# Patient Record
Sex: Female | Born: 1935 | Race: White | Hispanic: No | Smoking: Never smoker
Health system: Southern US, Community
[De-identification: ages and names within clinical notes are randomized; demographics above are authoritative.]

## PROBLEM LIST (undated history)

## (undated) DIAGNOSIS — C801 Malignant (primary) neoplasm, unspecified: Secondary | ICD-10-CM

## (undated) DIAGNOSIS — M1990 Unspecified osteoarthritis, unspecified site: Secondary | ICD-10-CM

## (undated) DIAGNOSIS — E785 Hyperlipidemia, unspecified: Secondary | ICD-10-CM

## (undated) DIAGNOSIS — I10 Essential (primary) hypertension: Secondary | ICD-10-CM

## (undated) DIAGNOSIS — I2510 Atherosclerotic heart disease of native coronary artery without angina pectoris: Secondary | ICD-10-CM

## (undated) DIAGNOSIS — G459 Transient cerebral ischemic attack, unspecified: Secondary | ICD-10-CM

## (undated) DIAGNOSIS — M7989 Other specified soft tissue disorders: Secondary | ICD-10-CM

## (undated) HISTORY — PX: ABDOMINAL HYSTERECTOMY: SHX81

## (undated) HISTORY — DX: Atherosclerotic heart disease of native coronary artery without angina pectoris: I25.10

## (undated) HISTORY — PX: TONSILLECTOMY: SUR1361

## (undated) HISTORY — PX: CORONARY ANGIOPLASTY WITH STENT PLACEMENT: SHX49

## (undated) HISTORY — DX: Essential (primary) hypertension: I10

## (undated) HISTORY — PX: EYE SURGERY: SHX253

## (undated) HISTORY — PX: APPENDECTOMY: SHX54

## (undated) HISTORY — DX: Hyperlipidemia, unspecified: E78.5

## (undated) SURGERY — OPEN REDUCTION INTERNAL FIXATION HIP
Anesthesia: Choice | Laterality: Left

---

## 1999-02-22 HISTORY — PX: KNEE ARTHROSCOPY: SUR90

## 1999-09-14 ENCOUNTER — Encounter: Admission: RE | Admit: 1999-09-14 | Discharge: 1999-09-14 | Payer: Self-pay | Admitting: Orthopedic Surgery

## 1999-09-14 ENCOUNTER — Encounter: Payer: Self-pay | Admitting: Orthopedic Surgery

## 1999-09-14 ENCOUNTER — Ambulatory Visit (HOSPITAL_BASED_OUTPATIENT_CLINIC_OR_DEPARTMENT_OTHER): Admission: RE | Admit: 1999-09-14 | Discharge: 1999-09-15 | Payer: Self-pay | Admitting: Orthopedic Surgery

## 1999-09-27 ENCOUNTER — Encounter: Admission: RE | Admit: 1999-09-27 | Discharge: 1999-10-15 | Payer: Self-pay | Admitting: Orthopedic Surgery

## 2001-12-11 ENCOUNTER — Other Ambulatory Visit: Admission: RE | Admit: 2001-12-11 | Discharge: 2001-12-11 | Payer: Self-pay | Admitting: Family Medicine

## 2008-01-07 ENCOUNTER — Encounter: Admission: RE | Admit: 2008-01-07 | Discharge: 2008-01-07 | Payer: Self-pay | Admitting: Family Medicine

## 2008-03-07 ENCOUNTER — Emergency Department (HOSPITAL_COMMUNITY): Admission: EM | Admit: 2008-03-07 | Discharge: 2008-03-07 | Payer: Self-pay | Admitting: Emergency Medicine

## 2009-06-15 ENCOUNTER — Ambulatory Visit: Payer: Medicare Other | Admitting: Ophthalmology

## 2009-06-22 ENCOUNTER — Ambulatory Visit: Payer: Medicare Other | Admitting: Ophthalmology

## 2009-08-27 IMAGING — US US CAROTID DUPLEX BILAT
1 series · 13 of 24 positions shown · non-contrast
Comparison: None

CLINICAL DATA: Recurrent TIA with episodes of inability to speak
and diplopia.

BILATERAL CAROTID DUPLEX ULTRASOUND
TECHNIQUE: Gray scale imaging, color Doppler and duplex ultrasound
was performed of bilateral carotid and vertebral arteries in the
neck.

[Series 1: us carotid duplex bilat · 0.08mm/px · 13 of 68 slices shown]
[im 1/68]
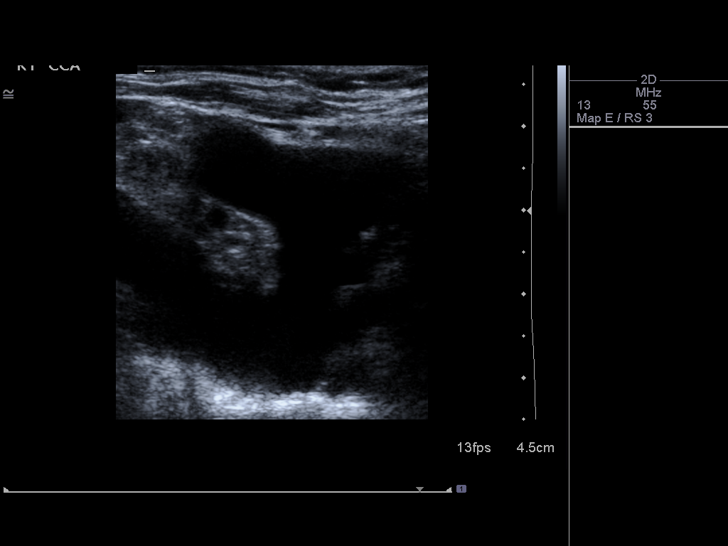
[im 6/68]
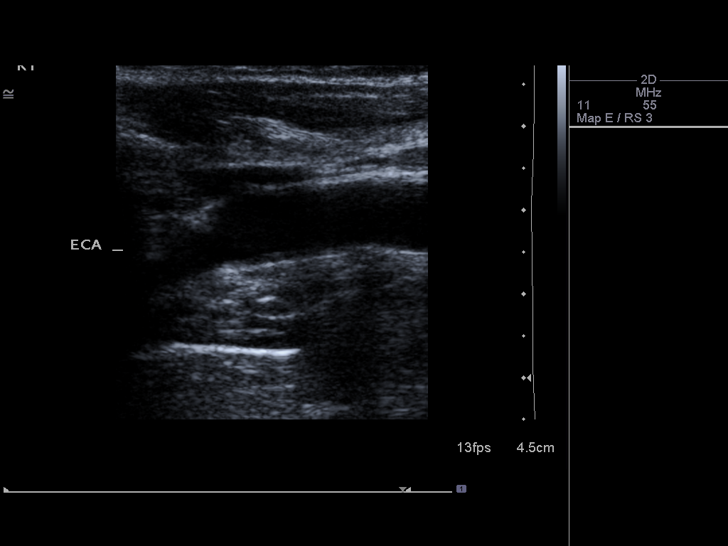
[im 12/68]
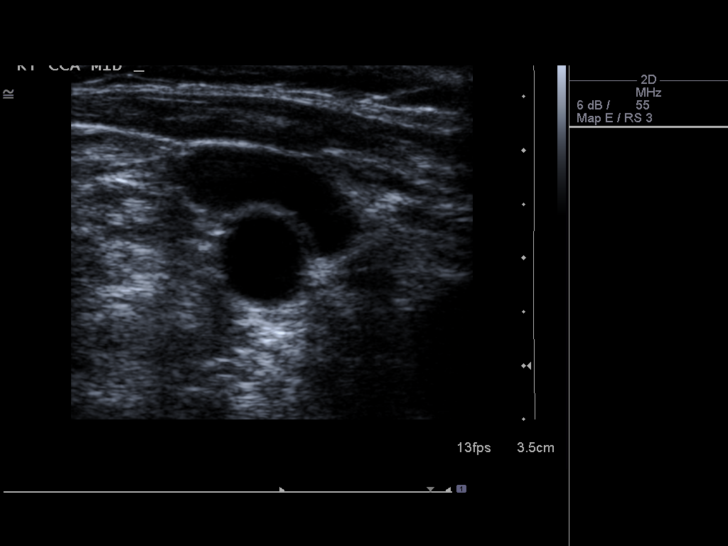
[im 18/68]
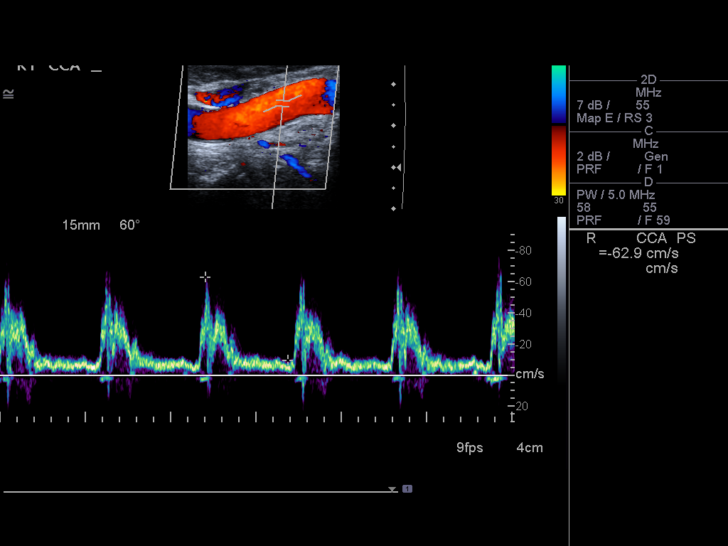
[im 24/68]
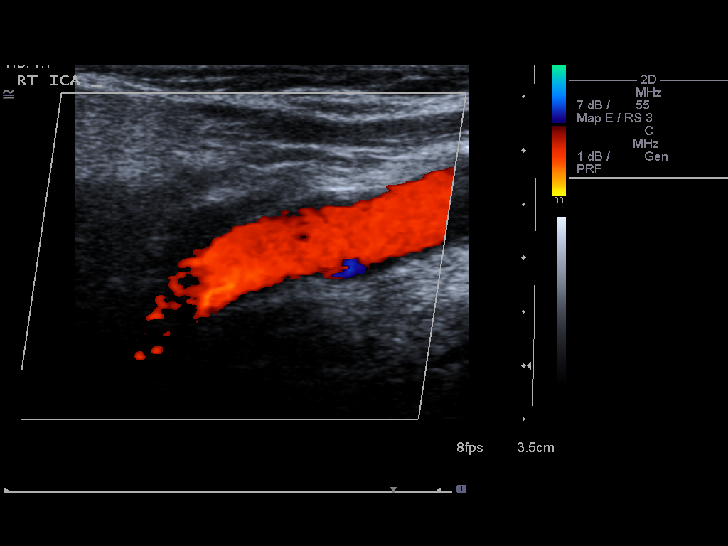
[im 30/68]
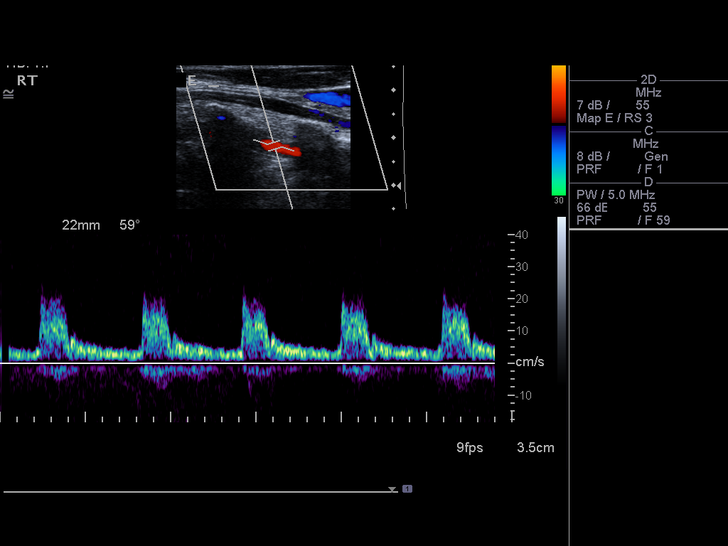
[im 35/68]
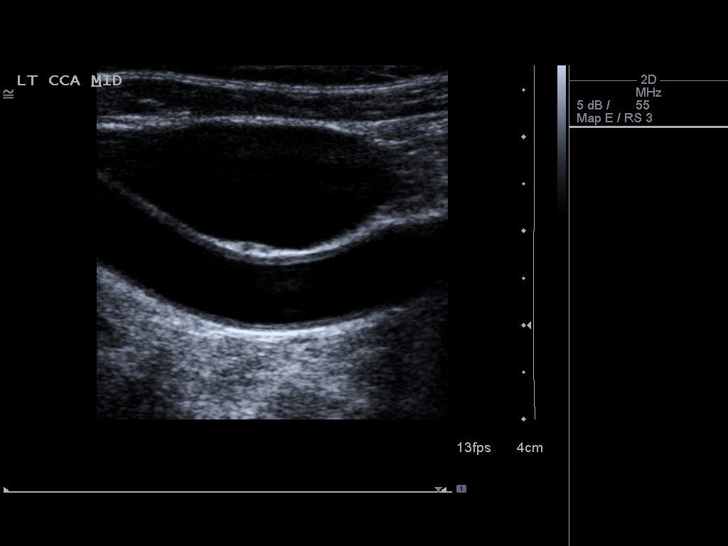
[im 38/68]
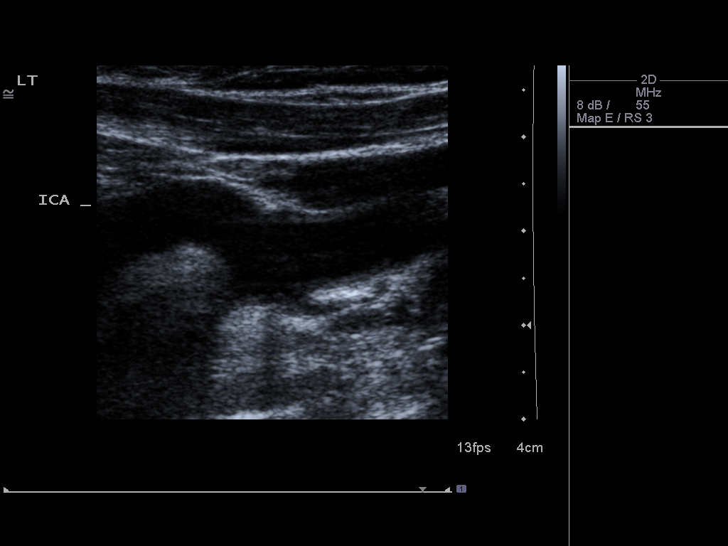
[im 44/68]
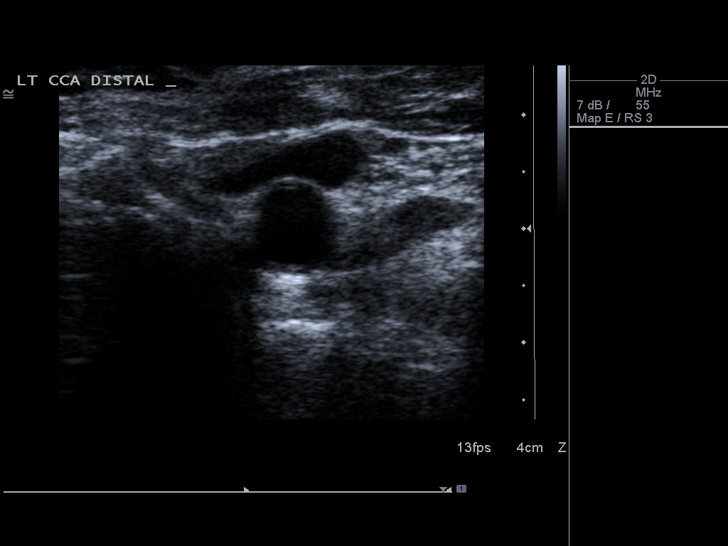
[im 50/68]
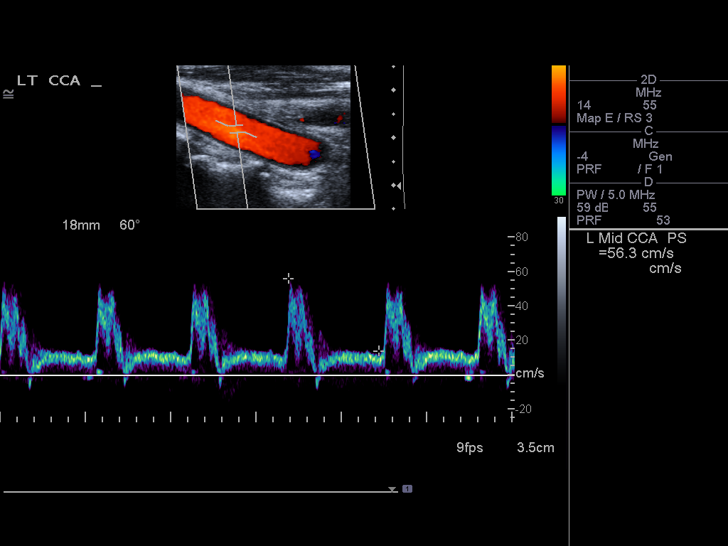
[im 56/68]
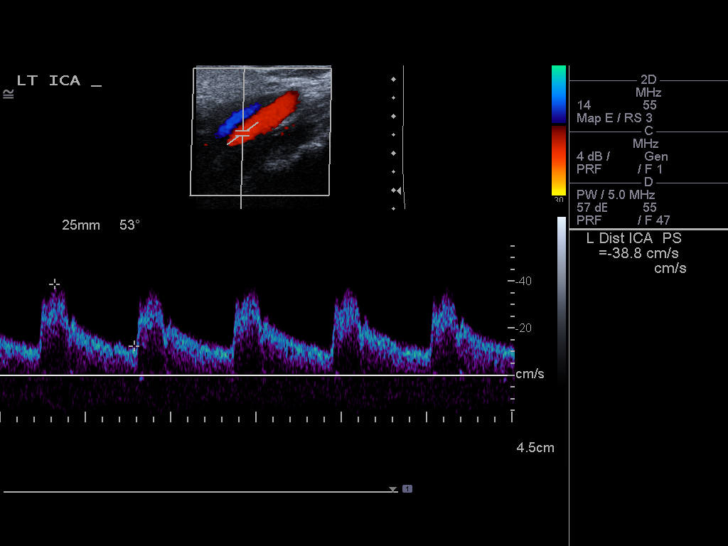
[im 62/68]
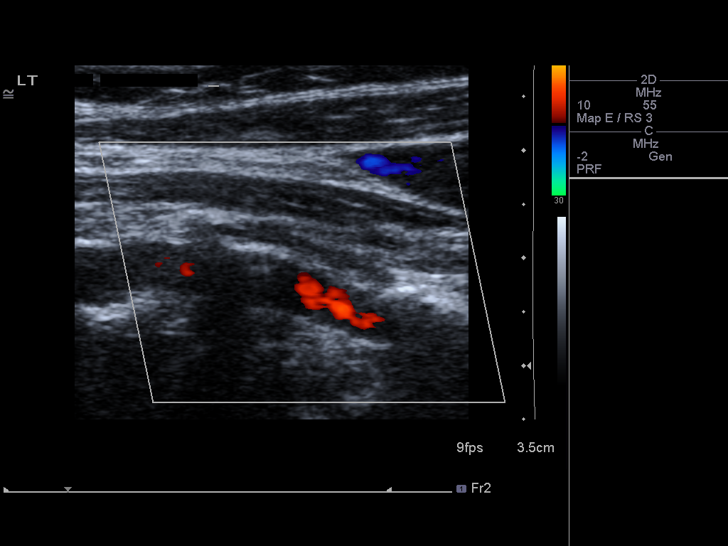
[im 68/68]
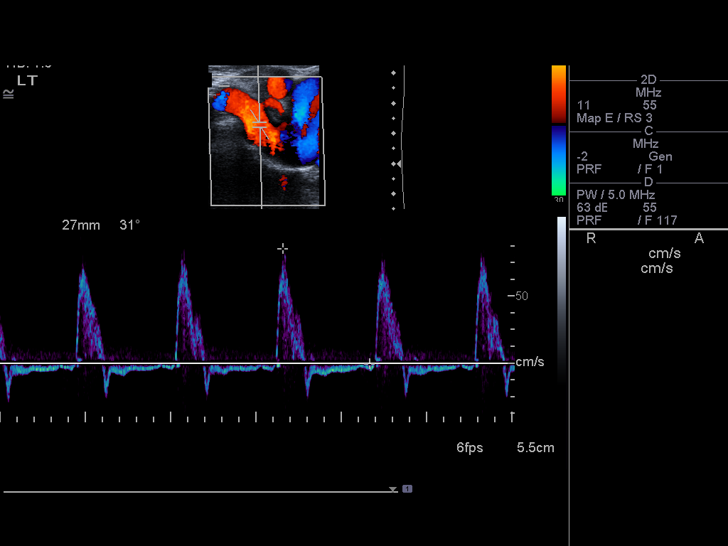

[13 of 24 positions shown; findings below may reference images not displayed]

Criteria:  Quantification of carotid stenosis is based on velocity
parameters that correlate the residual internal carotid diameter
with NASCET-based stenosis levels.

The following velocity measurements were obtained:

                 PEAK SYSTOLIC/END DIASTOLIC
RIGHT
ICA:                        55/17cm/sec
CCA:                        63/10cm/sec
SYSTOLIC ICA/CCA RATIO:
DIASTOLIC ICA/CCA RATIO:
ECA:                        30cm/sec

LEFT
ICA:                        39/12cm/sec
CCA:                        56/14cm/sec
SYSTOLIC ICA/CCA RATIO:
DIASTOLIC ICA/CCA RATIO:
ECA:                        34cm/sec
FINDINGS: RIGHT CAROTID ARTERY: Minimal plaque at right carotid bifurcation.
Normal velocities and waveforms correspond to an estimated less
than 50% ICA stenosis.  The internal carotid artery is moderately
tortuous in the neck.

RIGHT VERTEBRAL ARTERY:  Antegrade flow with normal wave form.

LEFT CAROTID ARTERY: Mild plaque in the distal common carotid
artery and proximal ICA.  Unremarkable velocities and waveforms
correspond to an estimated left ICA stenosis of less than 50%.

LEFT VERTEBRAL ARTERY:  Antegrade flow with normal wave form.

Cystic nodules are identified in the left lobe of the thyroid.
These are not fully characterized by carotid ultrasound.  Formal
evaluation with ultrasound of the thyroid gland may be helpful if
not previously performed.
IMPRESSION: No significant carotid stenosis identified by duplex ultrasound.
Bilateral estimated less than 50% ICA stenoses with minimal amount
of bilateral plaque.  Cystic nodules in the left lobe of thyroid
identified incidentally.  Evaluation with thyroid ultrasound may be
helpful.

## 2009-09-21 HISTORY — PX: LEFT HEART CATH: SHX5946

## 2009-10-09 ENCOUNTER — Inpatient Hospital Stay (HOSPITAL_COMMUNITY): Admission: EM | Admit: 2009-10-09 | Discharge: 2009-10-13 | Payer: Self-pay | Admitting: Emergency Medicine

## 2009-10-09 ENCOUNTER — Ambulatory Visit: Payer: Self-pay | Admitting: Cardiology

## 2009-10-10 ENCOUNTER — Encounter: Payer: Self-pay | Admitting: Cardiology

## 2009-10-20 DIAGNOSIS — I2510 Atherosclerotic heart disease of native coronary artery without angina pectoris: Secondary | ICD-10-CM | POA: Insufficient documentation

## 2009-10-20 DIAGNOSIS — E785 Hyperlipidemia, unspecified: Secondary | ICD-10-CM | POA: Insufficient documentation

## 2009-10-20 DIAGNOSIS — I10 Essential (primary) hypertension: Secondary | ICD-10-CM

## 2009-10-20 HISTORY — DX: Atherosclerotic heart disease of native coronary artery without angina pectoris: I25.10

## 2009-10-20 HISTORY — DX: Essential (primary) hypertension: I10

## 2009-10-20 HISTORY — DX: Hyperlipidemia, unspecified: E78.5

## 2009-10-29 ENCOUNTER — Ambulatory Visit: Payer: Self-pay | Admitting: Internal Medicine

## 2009-10-29 ENCOUNTER — Encounter: Payer: Self-pay | Admitting: Physician Assistant

## 2009-10-29 ENCOUNTER — Encounter: Payer: Self-pay | Admitting: Cardiology

## 2009-11-02 ENCOUNTER — Encounter: Payer: Self-pay | Admitting: Cardiology

## 2009-11-05 ENCOUNTER — Encounter: Payer: Self-pay | Admitting: Cardiology

## 2009-12-24 ENCOUNTER — Encounter (INDEPENDENT_AMBULATORY_CARE_PROVIDER_SITE_OTHER): Payer: Self-pay | Admitting: *Deleted

## 2009-12-31 ENCOUNTER — Ambulatory Visit: Payer: Self-pay | Admitting: Cardiology

## 2010-03-25 NOTE — Assessment & Plan Note (Signed)
Summary: eph./ appt is 11:30/ gd   Visit Type:  Post-hospital  CC:  Sob.  History of Present Illness: This is an 77 year old white female patient who was admitted to the hospital with chest pain and had a slight bump in her cardiac enzymes she underwent cardiac catheterization with stenting to the LAD October 09, 2009. She had diffuse 30% stenosis throughout the proximal mid RCA and 20% plaque in the proximal circumflex ejection fraction was 60%. She was placed on a beta blocker because of her bradycardia.  The patient has progressed slowly his discharge. She can walk 5 minutes before she becomes tired and short of breath. She has had dyspnea on exertion for several years and poor exercise tolerance. She denies any further chest pain palpitations dizziness or presyncope.  Current Medications (verified): 1)  Plavix 75 Mg Tabs (Clopidogrel Bisulfate) .... Take One Tablet By Mouth Daily 2)  Aspirin Ec 325 Mg Tbec (Aspirin) .... Take One Tablet By Mouth Daily 3)  Citracal Plus Bone Density  Tabs (Multiple Minerals-Vitamins) .... Take 1 Tablet By Mouth Once A Day 4)  Centrum Silver  Tabs (Multiple Vitamins-Minerals) .... Take 1 Tablet By Mouth Once A Day 5)  Pravastatin Sodium 40 Mg Tabs (Pravastatin Sodium) .... Take One Tablet By Mouth Daily At Bedtime 6)  Lisinopril 40 Mg Tabs (Lisinopril) .... Take One Tablet By Mouth Daily  Allergies (verified): 1)  ! Bactrim  Past History:  Past Medical History: Last updated: 10/20/2009  1. Two-vessel coronary artery disease, status post percutaneous       transluminal coronary angioplasty/drug-eluting stent of left       anterior descending.   2. Hypertension.   3. Hyperlipidemia.  Review of Systems       see history of present illness  Vital Signs:  Patient profile:   77 year old female Height:      64 inches Weight:      154.25 pounds BMI:     26.57 Pulse rate:   77 / minute Pulse rhythm:   regular Resp:     20 per minute BP sitting:    120 / 80  (left arm) Cuff size:   large  Vitals Entered By: Vikki Ports (October 29, 2009 11:16 AM)  Physical Exam  General:   Well-nournished, in no acute distress. Neck: No JVD, HJR, Bruit, or thyroid enlargement Lungs: No tachypnea, clear without wheezing, rales, or rhonchi Cardiovascular: RRR, PMI not displaced, heart sounds normal, no murmurs, gallops, bruit, thrill, or heave. Abdomen: BS normal. Soft without organomegaly, masses, lesions or tenderness. Extremities: right radial artery without hematoma or hemorrhage, lower extremities without cyanosis, clubbing or edema. Good distal pulses bilateral SKin: Warm, no lesions or rashes  Musculoskeletal: No deformities Neuro: no focal signs    EKG  Procedure date:  10/29/2009  Findings:      normal sinus rhythm normal EKG  Impression & Recommendations:  Problem # 1:  CAD (ICD-414.00)  Patient underwent stenting of the LAD October 09, 2009 with a slight bump in her cardiac enzymes. Patient is progressing slowly. I've recommended cardiac rehabilitation to help get her back on her feet again. Her updated medication list for this problem includes:    Plavix 75 Mg Tabs (Clopidogrel bisulfate) .Marland Kitchen... Take one tablet by mouth daily    Aspirin Ec 325 Mg Tbec (Aspirin) .Marland Kitchen... Take one tablet by mouth daily    Lisinopril 40 Mg Tabs (Lisinopril) .Marland Kitchen... Take one tablet by mouth daily  Orders: EKG w/  Interpretation (93000) Misc. Referral (Misc. Ref)  Her updated medication list for this problem includes:    Plavix 75 Mg Tabs (Clopidogrel bisulfate) .Marland Kitchen... Take one tablet by mouth daily    Aspirin Ec 325 Mg Tbec (Aspirin) .Marland Kitchen... Take one tablet by mouth daily    Lisinopril 40 Mg Tabs (Lisinopril) .Marland Kitchen... Take one tablet by mouth daily  Problem # 2:  HYPERTENSION (ICD-401.9)  treated Her updated medication list for this problem includes:    Aspirin Ec 325 Mg Tbec (Aspirin) .Marland Kitchen... Take one tablet by mouth daily    Lisinopril 40 Mg Tabs  (Lisinopril) .Marland Kitchen... Take one tablet by mouth daily  Orders: EKG w/ Interpretation (93000)  Her updated medication list for this problem includes:    Aspirin Ec 325 Mg Tbec (Aspirin) .Marland Kitchen... Take one tablet by mouth daily    Lisinopril 40 Mg Tabs (Lisinopril) .Marland Kitchen... Take one tablet by mouth daily  Problem # 3:  HYPERLIPIDEMIA (ICD-272.4)  treated Her updated medication list for this problem includes:    Pravastatin Sodium 40 Mg Tabs (Pravastatin sodium) .Marland Kitchen... Take one tablet by mouth daily at bedtime  Her updated medication list for this problem includes:    Pravastatin Sodium 40 Mg Tabs (Pravastatin sodium) .Marland Kitchen... Take one tablet by mouth daily at bedtime  Patient Instructions: 1)  Your physician recommends that you schedule a follow-up appointment in: 2 months with Dr. Daleen Squibb. 2)  Your physician recommends that you continue on your current medications as directed. Please refer to the Current Medication list given to you today. 3)  Your physician recommends referral and attendance at a Cardiac Rehab Program. Post. stent placement.

## 2010-03-25 NOTE — Miscellaneous (Signed)
Summary: [facility]   [facility]   Imported By: Roderic Ovens 11/26/2009 15:50:52  _____________________________________________________________________  External Attachment:    Type:   Image     Comment:   External Document

## 2010-03-25 NOTE — Letter (Signed)
Summary: Appointment - Missed  [facility], Main Office  [address]   [place], [place] [address]   Phone: [phone]  Fax: [phone]     December 24, 2009 MRN: [id number]   [facility] [address] [place], [place]  [address]   Dear Ms. Poplar,  Our records indicate you missed your appointment on 12-24-2009 with Dr.Wall.                                    It is very important that we reach you to reschedule this appointment. We look forward to participating in your health care needs. Please contact us at the number listed above at your earliest convenience to reschedule this appointment.     Sincerely,      Lorne Skeens  [facility] Scheduling Team

## 2010-03-25 NOTE — Miscellaneous (Signed)
Summary: [facility] Physician Order/Treatment Plan   [facility] Physician Order/Treatment Plan   Imported By: Roderic Ovens 11/10/2009 15:31:07  _____________________________________________________________________  External Attachment:    Type:   Image     Comment:   External Document

## 2010-03-25 NOTE — Assessment & Plan Note (Signed)
Summary: rov./ gd   Visit Type:  rov Primary Carel Schnee:  Burnell Blanks  CC:  edema/left leg...denies any other cardiac complaints today.  History of Present Illness: Mrs Leyh discharge for close followup for her coronary disease.  She's having no chest pain or angina. This is how she presented and she had her stent placed in August.  She has some left lower extremity edema but no pain or localized swelling. This is a chronic problem.  I reviewed her laboratory data today from her primary care which looks remarkably good. We discussed this.  She's had no chest discomfort as mentioned above and does not use nitroglycerin. Her only complaint is easy bruisability.  Clinical Reports Reviewed:  Cardiac Cath:  10/13/2009: Cardiac Cath Findings:  IMPRESSION:   1. Single-vessel coronary artery disease.   2. Normal left ventricular systolic function.   3. Successful percutaneous transluminal coronary angioplasty with       placement of a drug-eluting stent in the proximal left anterior       descending coronary artery.      RECOMMENDATIONS:  The patient should be continued on aspirin and Plavix   for 1 year.  We will continue her ACE inhibitor and statin as written.   I will not start a beta-blocker this time secondary to her bradycardia.       Current Medications (verified): 1)  Plavix 75 Mg Tabs (Clopidogrel Bisulfate) .... Take One Tablet By Mouth Daily 2)  Aspirin Ec 325 Mg Tbec (Aspirin) .... Take One Tablet By Mouth Daily 3)  Citracal Plus Bone Density  Tabs (Multiple Minerals-Vitamins) .... Take 1 Tablet By Mouth Once A Day 4)  Centrum Silver  Tabs (Multiple Vitamins-Minerals) .... Take 1 Tablet By Mouth Once A Day 5)  Pravastatin Sodium 40 Mg Tabs (Pravastatin Sodium) .... Take One Tablet By Mouth Daily At Bedtime 6)  Lisinopril 40 Mg Tabs (Lisinopril) .... Take One Tablet By Mouth Daily 7)  Nitrostat 0.4 Mg Subl (Nitroglycerin) .Marland Kitchen.. 1 Tablet Under Tongue At Onset of Chest  Pain; You May Repeat Every 5 Minutes For Up To 3 Doses.  Allergies: 1)  ! Bactrim  Past History:  Past Medical History: Last updated: 10/20/2009  1. Two-vessel coronary artery disease, status post percutaneous       transluminal coronary angioplasty/drug-eluting stent of left       anterior descending.   2. Hypertension.   3. Hyperlipidemia.  Past Surgical History: Last updated: 10/20/2009 . Right knee operative arthroscopy   Social History: Last updated: 10/20/2009 non-smoker, non-drinker, no drug abuse  Review of Systems       negative other than history of present illness  Vital Signs:  Patient profile:   77 year old female Height:      64 inches Weight:      155 pounds BMI:     26.70 Pulse rate:   76 / minute Pulse rhythm:   regular BP sitting:   158 / 90  (left arm) Cuff size:   large  Vitals Entered By: Danielle Rankin, CMA (December 31, 2009 10:22 AM)  Physical Exam  General:  well-developed well-nourished no acute distress, extremity pleasant Head:  normocephalic and atraumatic Eyes:  PERRLA/EOM intact; conjunctiva and lids normal. Neck:  Neck supple, no JVD. No masses, thyromegaly or abnormal cervical nodes. Chest Wall:  no deformities or breast masses noted Lungs:  Clear bilaterally to auscultation and percussion. Heart:  regular rate and rhythm, normal S1-S2, no obvious carotid bruit Msk:  Back  normal, normal gait. Muscle strength and tone normal. Pulses:  pulses normal in all 4 extremities Extremities:  1+ left pedal edema and trace right pedal edema.   Neurologic:  Alert and oriented x 3. Skin:  Intact without lesions or rashes. Psych:  Normal affect.   Impression & Recommendations:  Problem # 1:  HYPERTENSION (ICD-401.9)  Her updated medication list for this problem includes:    Aspirin 81 Mg Chew (Aspirin) .Marland Kitchen... Take 1 tablet daily    Lisinopril 40 Mg Tabs (Lisinopril) .Marland Kitchen... Take one tablet by mouth daily  Problem # 2:  CAD  (ICD-414.00) Assessment: Improved continue medical therapy with Plavix at least for one year. We'll decrease her aspirin 81 mg a day with her bruising. Follow with me and August of 2012 Her updated medication list for this problem includes:    Plavix 75 Mg Tabs (Clopidogrel bisulfate) .Marland Kitchen... Take one tablet by mouth daily    Aspirin 81 Mg Chew (Aspirin) .Marland Kitchen... Take 1 tablet daily    Lisinopril 40 Mg Tabs (Lisinopril) .Marland Kitchen... Take one tablet by mouth daily    Nitrostat 0.4 Mg Subl (Nitroglycerin) .Marland Kitchen... 1 tablet under tongue at onset of chest pain; you may repeat every 5 minutes for up to 3 doses.  Problem # 3:  HYPERLIPIDEMIA (ICD-272.4)  Her updated medication list for this problem includes:    Pravastatin Sodium 40 Mg Tabs (Pravastatin sodium) .Marland Kitchen... Take one tablet by mouth daily at bedtime  Patient Instructions: 1)  Your physician recommends that you schedule a follow-up appointment in: August 2012 2)  Your physician has recommended you make the following change in your medication:

## 2010-04-14 ENCOUNTER — Telehealth: Payer: Self-pay | Admitting: Cardiology

## 2010-04-20 NOTE — Progress Notes (Signed)
Summary: Plavix refilled  Phone Note Refill Request Call back at Home Phone [phone] Message from:  Patient  Refills Requested: Medication #1:  PLAVIX 75 MG TABS Take one tablet by mouth daily pt needs refill called into [facility] in [place] [facility] [place] [facility] the pt not sure about st names   Initial call taken by: Omer Jack,  April 14, 2010 10:43 AM    Prescriptions: PLAVIX 75 MG TABS (CLOPIDOGREL BISULFATE) Take one tablet by mouth daily  #30 x 6   Entered by:   Celestia Khat, CMA   Authorized by:   Gaylord Shih, MD, [facility]   Signed by:   Celestia Khat, CMA on 04/14/2010   Method used:   Electronically to        [facility] (retail)       [address]       [place], [place]  [address]       Ph: [phone] or [phone]       Fax: [phone]   RxID:   [id number]

## 2010-05-07 LAB — CBC
HCT: 34.6 % — ABNORMAL LOW (ref 36.0–46.0)
Hemoglobin: 11.8 g/dL — ABNORMAL LOW (ref 12.0–15.0)
Hemoglobin: 12.2 g/dL (ref 12.0–15.0)
Hemoglobin: 12.5 g/dL (ref 12.0–15.0)
MCH: 29.5 pg (ref 26.0–34.0)
MCH: 29.7 pg (ref 26.0–34.0)
MCH: 31.3 pg (ref 26.0–34.0)
MCHC: 32.4 g/dL (ref 30.0–36.0)
MCHC: 32.6 g/dL (ref 30.0–36.0)
MCHC: 32.8 g/dL (ref 30.0–36.0)
MCHC: 33 g/dL (ref 30.0–36.0)
MCHC: 34.1 g/dL (ref 30.0–36.0)
MCV: 91.8 fL (ref 78.0–100.0)
Platelets: 171 10*3/uL (ref 150–400)
Platelets: 238 10*3/uL (ref 150–400)
RBC: 3.77 MIL/uL — ABNORMAL LOW (ref 3.87–5.11)
RDW: 12.8 % (ref 11.5–15.5)
RDW: 12.9 % (ref 11.5–15.5)
RDW: 12.9 % (ref 11.5–15.5)
WBC: 6.6 10*3/uL (ref 4.0–10.5)

## 2010-05-07 LAB — TROPONIN I: Troponin I: 0.02 ng/mL (ref 0.00–0.06)

## 2010-05-07 LAB — COMPREHENSIVE METABOLIC PANEL
ALT: 13 U/L (ref 0–35)
AST: 20 U/L (ref 0–37)
CO2: 25 mEq/L (ref 19–32)
Chloride: 106 mEq/L (ref 96–112)
Creatinine, Ser: 0.92 mg/dL (ref 0.4–1.2)
GFR calc Af Amer: >60 mL/min (ref >60)
GFR calc non Af Amer: 59 mL/min — ABNORMAL LOW (ref >60)
Total Bilirubin: 0.4 mg/dL (ref 0.3–1.2)

## 2010-05-07 LAB — DIFFERENTIAL
Basophils Absolute: 0 10*3/uL (ref 0.0–0.1)
Lymphocytes Relative: 11 % — ABNORMAL LOW (ref 12–46)
Neutro Abs: 4.9 10*3/uL (ref 1.7–7.7)

## 2010-05-07 LAB — BASIC METABOLIC PANEL
BUN: 15 mg/dL (ref 6–23)
Calcium: 9.2 mg/dL (ref 8.4–10.5)
Calcium: 9.3 mg/dL (ref 8.4–10.5)
Creatinine, Ser: 0.98 mg/dL (ref 0.4–1.2)
Creatinine, Ser: 1.11 mg/dL (ref 0.4–1.2)
GFR calc Af Amer: 57 mL/min — ABNORMAL LOW (ref >60)
GFR calc non Af Amer: 47 mL/min — ABNORMAL LOW (ref >60)
GFR calc non Af Amer: 54 mL/min — ABNORMAL LOW (ref >60)
Glucose, Bld: 100 mg/dL — ABNORMAL HIGH (ref 70–99)
Glucose, Bld: 93 mg/dL (ref 70–99)
Sodium: 139 mEq/L (ref 135–145)

## 2010-05-07 LAB — LIPID PANEL: Triglycerides: 114 mg/dL (ref <150)

## 2010-05-07 LAB — BRAIN NATRIURETIC PEPTIDE
Pro B Natriuretic peptide (BNP): 43 pg/mL (ref 0.0–100.0)
Pro B Natriuretic peptide (BNP): 47 pg/mL (ref 0.0–100.0)

## 2010-05-07 LAB — POCT I-STAT, CHEM 8
BUN: 16 mg/dL (ref 6–23)
Chloride: 106 mEq/L (ref 96–112)
Sodium: 137 mEq/L (ref 135–145)

## 2010-05-07 LAB — POCT CARDIAC MARKERS
CKMB, poc: <1 ng/mL — ABNORMAL LOW (ref 1.0–8.0)
Myoglobin, poc: 74.2 ng/mL (ref 12–200)
Troponin i, poc: <0.05 ng/mL (ref 0.00–0.09)

## 2010-05-07 LAB — HEPARIN LEVEL (UNFRACTIONATED)
Heparin Unfractionated: 0.28 IU/mL — ABNORMAL LOW (ref 0.30–0.70)
Heparin Unfractionated: 0.29 IU/mL — ABNORMAL LOW (ref 0.30–0.70)
Heparin Unfractionated: 0.46 IU/mL (ref 0.30–0.70)

## 2010-05-07 LAB — CARDIAC PANEL(CRET KIN+CKTOT+MB+TROPI)
CK, MB: 1.3 ng/mL (ref 0.3–4.0)
Relative Index: INVALID (ref 0.0–2.5)
Total CK: 25 U/L (ref 7–177)
Troponin I: 0.02 ng/mL (ref 0.00–0.06)
Troponin I: 0.17 ng/mL — ABNORMAL HIGH (ref 0.00–0.06)

## 2010-05-07 LAB — CK TOTAL AND CKMB (NOT AT ARMC): Relative Index: INVALID (ref 0.0–2.5)

## 2010-05-07 LAB — APTT: aPTT: 31 seconds (ref 24–37)

## 2010-05-31 ENCOUNTER — Encounter: Payer: Self-pay | Admitting: Cardiology

## 2010-06-04 ENCOUNTER — Encounter: Payer: Self-pay | Admitting: Cardiology

## 2010-06-04 ENCOUNTER — Ambulatory Visit (INDEPENDENT_AMBULATORY_CARE_PROVIDER_SITE_OTHER): Payer: Medicare Other | Admitting: Cardiology

## 2010-06-04 VITALS — BP 188/92 | HR 61 | Resp 12 | Ht 65.0 in | Wt 157.0 lb

## 2010-06-04 DIAGNOSIS — I2510 Atherosclerotic heart disease of native coronary artery without angina pectoris: Secondary | ICD-10-CM

## 2010-06-04 DIAGNOSIS — R609 Edema, unspecified: Secondary | ICD-10-CM

## 2010-06-04 DIAGNOSIS — R600 Localized edema: Secondary | ICD-10-CM

## 2010-06-04 DIAGNOSIS — I10 Essential (primary) hypertension: Secondary | ICD-10-CM

## 2010-06-04 MED ORDER — FUROSEMIDE 20 MG PO TABS: ORAL_TABLET | ORAL | Status: DC

## 2010-06-04 NOTE — Assessment & Plan Note (Signed)
Worse, see above plan.

## 2010-06-04 NOTE — Patient Instructions (Signed)
1. Your physician recommends that you schedule a follow-up appointment on: Friday 06/11/10 for a blood pressure check and lab work. 2. Your physician has recommended you make the following change in your medication: Start  Furosemide (lasix) 10mg  daily. 3. Eat a diet rich in potassium.  Please see handout given on potassium rich foods. 4. Your physician recommends that you schedule a follow-up appointment in: 1 year with Dr. Daleen Squibb

## 2010-06-04 NOTE — Assessment & Plan Note (Signed)
Stable

## 2010-06-04 NOTE — Progress Notes (Signed)
   Patient ID: [facility], female    DOB: 07/22/1926, 77 y.o.   MRN: [id number]  HPI  [facility] comes in for Eand M of her CAD, HTN, and edema. She denies any angina or chest pain. She does have edema and says her BP has been running high. Denies any sxs of TIA's. She took her lisinopril this am. She is compliant.  EKG shows NSR with NSST changes.    Review of Systems  All other systems reviewed and are negative.      Physical Exam  Nursing note and vitals reviewed. Constitutional: She is oriented to person, place, and time. She appears well-developed and well-nourished.  HENT:  Head: Normocephalic and atraumatic.  Eyes: EOM are normal. Pupils are equal, round, and reactive to light.  Neck: Neck supple. No JVD present. No tracheal deviation present. No thyromegaly present.       No bruits  Cardiovascular: Normal rate, regular rhythm, normal heart sounds and intact distal pulses.   No murmur heard. Pulmonary/Chest: Effort normal and breath sounds normal.  Abdominal: Soft. Bowel sounds are normal.  Musculoskeletal: She exhibits edema.  Neurological: She is alert and oriented to person, place, and time.  Skin: Skin is warm and dry.  Psychiatric: She has a normal mood and affect.

## 2010-06-04 NOTE — Assessment & Plan Note (Signed)
Will add furosamide 20mg  q day with K rich diet. BP check and BMET next 2 weeks. Low salt diet emphasized.

## 2010-06-07 LAB — DIFFERENTIAL
Lymphocytes Relative: 8 % — ABNORMAL LOW (ref 12–46)
Lymphs Abs: 0.3 10*3/uL — ABNORMAL LOW (ref 0.7–4.0)
Monocytes Absolute: 0.3 10*3/uL (ref 0.1–1.0)
Monocytes Relative: 6 % (ref 3–12)
Neutro Abs: 3.3 10*3/uL (ref 1.7–7.7)
Neutrophils Relative %: 81 % — ABNORMAL HIGH (ref 43–77)

## 2010-06-07 LAB — CBC
Hemoglobin: 14.1 g/dL (ref 12.0–15.0)
MCHC: 33.5 g/dL (ref 30.0–36.0)
RBC: 4.61 MIL/uL (ref 3.87–5.11)
WBC: 4 10*3/uL (ref 4.0–10.5)

## 2010-06-07 LAB — BASIC METABOLIC PANEL
CO2: 22 mEq/L (ref 19–32)
Calcium: 9 mg/dL (ref 8.4–10.5)
Creatinine, Ser: 1.29 mg/dL — ABNORMAL HIGH (ref 0.4–1.2)
GFR calc Af Amer: 48 mL/min — ABNORMAL LOW (ref >60)
GFR calc non Af Amer: 40 mL/min — ABNORMAL LOW (ref >60)
Sodium: 134 mEq/L — ABNORMAL LOW (ref 135–145)

## 2010-06-11 ENCOUNTER — Other Ambulatory Visit (INDEPENDENT_AMBULATORY_CARE_PROVIDER_SITE_OTHER): Payer: Medicare Other | Admitting: *Deleted

## 2010-06-11 DIAGNOSIS — I10 Essential (primary) hypertension: Secondary | ICD-10-CM

## 2010-06-11 LAB — BASIC METABOLIC PANEL
BUN: 24 mg/dL — ABNORMAL HIGH (ref 6–23)
CO2: 32 mEq/L (ref 19–32)
Chloride: 106 mEq/L (ref 96–112)
Creatinine, Ser: 1.1 mg/dL (ref 0.4–1.2)
Glucose, Bld: 77 mg/dL (ref 70–99)
Potassium: 4.3 mEq/L (ref 3.5–5.1)

## 2010-06-16 ENCOUNTER — Telehealth: Payer: Self-pay | Admitting: Cardiology

## 2010-06-16 NOTE — Telephone Encounter (Signed)
Pt rtn debbie's call from 4-24

## 2010-06-16 NOTE — Telephone Encounter (Signed)
Pt is aware of bmet results. She is trying to drink more water. She does feel better. Mylo Red RN

## 2010-06-16 NOTE — Telephone Encounter (Signed)
Pt aware of bmet results. Mylo Red RN

## 2010-07-09 NOTE — Op Note (Signed)
[place]  Patient:    Tyron Russell                       MRN: [id number] Proc. Date: 09/14/99 Attending:  Jearld Adjutant, M.D.                           Operative Report  PREOPERATIVE DIAGNOSIS:  Degenerative joint disease, rule out degenerative medial meniscus tear.  POSTOPERATIVE DIAGNOSES: 1. Extensive tricompartmental degenerative joint disease, grade 4 medial femoral    condylee and medial tibial plateau.  Grade 3-4 lateral femoral condyl and    lateral tibial plateau.  Grade 2-3 posterior patella and 4 posterior    patella superiorly with grooved trochlea, grade 3. 2. Degenerative medial and lateral meniscus tears. 3. Medial and lateral synovitis, severe. 4. Tight lateral retinaculum.  OPERATIONS: 1. Right knee operative arthroscopy with medial and lateral menisectomies    partial. 2. Abrasion chondroplasties throughout the joint, posterior patella, thyrocele    and both joint lines. 3. Medial and lateral synovectomies. 4. Lateral retinacular release.  SURGEON:  Jearld Adjutant, M.D.  ASSISTANT:  Currie Paris. Orma Flaming, P.A. DD:  09/14/99 TD:  09/15/99 Job: [id number]

## 2010-07-09 NOTE — Op Note (Signed)
[place]. [facility]  Patient:    K[facility], Kendra Stevenson                        MRN: [id number] Proc. Date: 09/14/99 Adm. Date:  [id number] Disc. Date: [id number] Attending:  Drema Pry CC:         Maricela Bo, M.D.             Jearld Adjutant, M.D.                           Operative Report  PREOPERATIVE DIAGNOSIS:  Right knee degenerative joint disease with degenerative medial meniscus tear.  POSTOPERATIVE DIAGNOSES: 1. Right knee degenerative mediolateral meniscus tears. 2. Grade 3-5 degenerative joint disease tricompartment. 3. Severe synovitis, medial and lateral. 4. Tight lateral retinaculum.  OPERATION: 1. Right knee operative arthroscopy with shaving and partial mediolateral    meniscectomies. 2. Lateral retinacular release. 3. Abrasion chondroplasties posterior patella, mediolateral femoral condyles,    and mediolateral tibia plateaus. 4. Synovectomies, tricompartment.  SURGEON:  Jearld Adjutant, M.D.  ASSISTANT:  Will Moye, P.A.C.  ANESTHESIA:  General with LMA.  CULTURES:  None.  DRAINS:  None.  ESTIMATED BLOOD LOSS:  Minimal.  TOURNIQUET TIME:  Without.  PATHOLOGIC FINDINGS AND HISTORY:  The patient is a 77 year old female referred by Maricela Bo, M.D. for knee pain of a months duration July 28, 1999. Exam was consistent with DJD with effusion, patellofemoral crepitation. X-rays showed right knee nearly bone-on-bone in the medial compartment with von Rosen view. We injected her with cortisone Marcaine. She came back August 27, 1999. At that time, she was improving. She then came September 08, 1999, with the knee hurting again, pain when she twists, catching sensation, with locking and giving way. She had to out of work where she does a lot of standing. At this point, it was my feeling that she had more mechanical symptoms and pure degenerative arthritic pain and that she may benefit from arthroscopic debridement. At surgery, this  was quite a knee with multiple areas of degenerative change. She was essentially bone down to eburnated bone in the medial compartment but needed smoothing and sculpting into a congruous surface, as well as shaving of a very degenerative medial meniscus that we shaved to a stable rim. Her ACL was intact. The lateral joint line had a similar posterior horn lateral meniscus tear with DJD in the lateral tibial plateau and lateral femoral condyle, not quite as severe as the medial side but also in need of contouring and smoothing of areas of raw bone, as well as the cartilage. She had grade 2 changes on the posterior trochlea, grade 3-4 changes posterior patella, with some areas of eburnated bone that we both shaved and used the ablator to smooth. A tight lateral retinaculum that we decided to do a lateral release to decompress the patellofemoral joint and to improve tracking and marked synovitis medially and laterally which we shaved. Perhaps, we can get surfaces converse enough to use Hyalgan and avoid a total knee arthroplasty, but it may be coming to that.  LABORATORY DATA:  Within normal limits.  PROCEDURE:  With adequate anesthesia obtained using LMA technique, 1 g Ancef given IV prophylaxis, the patient was placed in a supine position. The right lower extremity was prepped from the malleoli to the leg holder in the standard fashion. After standard prepping and draping, a superolateral  inflow portal was made. The knee was then inflated with normal saline with the arthroscopic pump. Mediolateral scope portals were then made and the joint was thoroughly inspected. We then shaved the medial synovitis to the sidewall and lysed medial bands. I then exposed the medial compartment and with basket and shaver saucerized the meniscus to a stable rim and smoothed with the shaver and contoured the concave tibial plateau to the convex medial femoral condyle that was essentially eburnated bone. I  then turned to the lateral compartment through reversing of portals and basically accomplished the same maneuver on the lateral side with partial lateral meniscectomy, saucerizing the posterior horn rim tear horizontal to a stable rim and contouring the joint surfaces. I then lightly shaved on the trochlea, the posterior patella, and used the ablator to further smooth the posterior patella. Lateral retinacular release was carried with out with an [employer] probe from the vastus lateralis to the joint line nicely decompressing that joint and improving tilt and lateral retinacular tightness. Synovectomy was completed and the lateral gutter and bleeding points cauterized. The knee was then irrigated through the scope 0.5% Marcaine injected in and about the portals. The portals were left open. A bulky, sterile, compressive dressing was applied with lateral foam pad for tamponade and the patient having tolerated the procedure well was awakened, taken to the recovery room in satisfactory condition for easy wrap, analgesia, to be discharged for outpatient routine - crutches or walker weightbearing as tolerated. Told to call the office for an appointment for recheck tomorrow afternoon. DD:  09/14/99 TD:  09/15/99 Job: [id number]

## 2010-12-01 ENCOUNTER — Telehealth: Payer: Self-pay | Admitting: Cardiology

## 2010-12-01 NOTE — Telephone Encounter (Signed)
Pt called. She is concerned about plavix. Should she stop or continue taking it. She has taken it for a year

## 2010-12-01 NOTE — Telephone Encounter (Signed)
I talked with pt. Pt states she had DES 10/01/09. Since it has been more than 1 year since DES was done, pt is asking if should continue taking Plavix or if it is OK to stop it. I will send to Dr Daleen Squibb for review and recommendations.

## 2010-12-09 ENCOUNTER — Telehealth: Payer: Self-pay | Admitting: Cardiology

## 2010-12-09 NOTE — Telephone Encounter (Signed)
Pt wants refill of plavix called to [facility]

## 2010-12-09 NOTE — Telephone Encounter (Signed)
Okay to stop. Continue aspirin 81 mg per day.

## 2010-12-09 NOTE — Telephone Encounter (Signed)
Lmtcb. Debbie Brinna Divelbiss RN  

## 2010-12-10 ENCOUNTER — Other Ambulatory Visit: Payer: Self-pay | Admitting: Cardiology

## 2010-12-10 MED ORDER — CLOPIDOGREL BISULFATE 75 MG PO TABS: 75.0000 mg | ORAL_TABLET | Freq: Every day | ORAL | Status: DC

## 2010-12-10 NOTE — Telephone Encounter (Signed)
Pt now calling to see if she needs to continue plavix? Was to take for one year and that was this past august, [phone]

## 2010-12-10 NOTE — Telephone Encounter (Signed)
Dr. Daleen Squibb agreed for pt to stop taken Plavix medication, but  she needs to continue taken Aspirin 81 mg daily. Patient aware.

## 2010-12-23 NOTE — Telephone Encounter (Signed)
Pt said her drug store told her they had a refill  for plavix there and she is still confused. Please call

## 2010-12-23 NOTE — Telephone Encounter (Signed)
Pt is aware she can discontinue plavix and continue taking an 81 mg aspirin daily.  She did not pick up the plavix. Mylo Red RN

## 2011-04-12 DIAGNOSIS — Z1231 Encounter for screening mammogram for malignant neoplasm of breast: Secondary | ICD-10-CM | POA: Diagnosis not present

## 2011-05-24 DIAGNOSIS — E7800 Pure hypercholesterolemia, unspecified: Secondary | ICD-10-CM | POA: Diagnosis not present

## 2011-05-24 DIAGNOSIS — Z79899 Other long term (current) drug therapy: Secondary | ICD-10-CM | POA: Diagnosis not present

## 2011-05-27 DIAGNOSIS — I10 Essential (primary) hypertension: Secondary | ICD-10-CM | POA: Diagnosis not present

## 2011-05-27 DIAGNOSIS — M810 Age-related osteoporosis without current pathological fracture: Secondary | ICD-10-CM | POA: Diagnosis not present

## 2011-05-27 DIAGNOSIS — E7800 Pure hypercholesterolemia, unspecified: Secondary | ICD-10-CM | POA: Diagnosis not present

## 2011-06-13 ENCOUNTER — Ambulatory Visit (INDEPENDENT_AMBULATORY_CARE_PROVIDER_SITE_OTHER): Payer: Medicare Other | Admitting: Cardiology

## 2011-06-13 ENCOUNTER — Encounter: Payer: Self-pay | Admitting: Cardiology

## 2011-06-13 VITALS — BP 146/88 | HR 59 | Ht 65.0 in | Wt 163.0 lb

## 2011-06-13 DIAGNOSIS — E785 Hyperlipidemia, unspecified: Secondary | ICD-10-CM

## 2011-06-13 DIAGNOSIS — I2510 Atherosclerotic heart disease of native coronary artery without angina pectoris: Secondary | ICD-10-CM | POA: Diagnosis not present

## 2011-06-13 DIAGNOSIS — R609 Edema, unspecified: Secondary | ICD-10-CM | POA: Diagnosis not present

## 2011-06-13 DIAGNOSIS — R600 Localized edema: Secondary | ICD-10-CM

## 2011-06-13 DIAGNOSIS — I10 Essential (primary) hypertension: Secondary | ICD-10-CM

## 2011-06-13 NOTE — Assessment & Plan Note (Signed)
Stable. No change in medical therapy. She continues to have sublingual nitroglycerin available and knows how to use it.

## 2011-06-13 NOTE — Patient Instructions (Signed)
Your physician wants you to follow-up in: 1 year with Dr. Wall. You will receive a reminder letter in the mail two months in advance. If you don't receive a letter, please call our office to schedule the follow-up appointment.  Your physician recommends that you continue on your current medications as directed. Please refer to the Current Medication list given to you today.  

## 2011-06-13 NOTE — Progress Notes (Signed)
HPI Kendra Stevenson comes in today for evaluation and management of her coronary artery disease. She is doing well without any angina or ischemic equivalence. He does get mildly short of breath when she vacuums but is mostly fatigued. She is very independent. She is in the process of planting a garden.  Her lower extremity swelling has been stable. It does not bother her during the day. She had a painless not that came up behind her left knee that comes and goes. No increased swelling pain or heat. She denies any chest pain.  Past Medical History  Diagnosis Date  . CAD 10/20/2009  . HYPERTENSION 10/20/2009  . HYPERLIPIDEMIA 10/20/2009    Current Outpatient Prescriptions  Medication Sig Dispense Refill  . amLODipine (NORVASC) 5 MG tablet Take 5 mg by mouth daily.      Marland Kitchen aspirin 81 MG tablet Take 81 mg by mouth daily.        . calcium citrate-vitamin D (CITRACAL+D) 315-200 MG-UNIT per tablet Take 1 tablet by mouth daily.        Marland Kitchen lisinopril (PRINIVIL,ZESTRIL) 40 MG tablet Take 40 mg by mouth daily.        . Multiple Vitamin (MULTIVITAMIN) tablet Take 1 tablet by mouth daily.        . nitroGLYCERIN (NITROSTAT) 0.4 MG SL tablet Place 0.4 mg under the tongue every 5 (five) minutes as needed.        . pravastatin (PRAVACHOL) 40 MG tablet Take 40 mg by mouth daily.          Allergies  Allergen Reactions  . Bactrim   . Sulfamethoxazole W/Trimethoprim     REACTION: rash    No family history on file.  History   Social History  . Marital Status: Widowed    Spouse Name: N/A    Number of Children: N/A  . Years of Education: N/A   Occupational History  . Not on file.   Social History Main Topics  . Smoking status: Never Smoker   . Smokeless tobacco: Not on file  . Alcohol Use: No  . Drug Use: No  . Sexually Active: Not on file   Other Topics Concern  . Not on file   Social History Narrative  . No narrative on file    ROS ALL NEGATIVE EXCEPT THOSE NOTED IN HPI  PE  General  Appearance: well developed, well nourished in no acute distress, looks young than stated age HEENT: symmetrical face, PERRLA, good dentition  Neck: no JVD, thyromegaly, or adenopathy, trachea midline Chest: symmetric without deformity Cardiac: PMI non-displaced, RRR, normal S1, S2, no gallop or murmur Lung: clear to ausculation and percussion Vascular: all pulses full without bruits  Abdominal: nondistended, nontender, good bowel sounds, no HSM, no bruits Extremities: no cyanosis, clubbing , 1+ pitting edema, no sign of DVT, no varicosities, no obvious Baker's cyst  Skin: normal color, no rashes Neuro: alert and oriented x 3, non-focal Pysch: normal affect  EKG Sinus bradycardia rate 59 beats per minute, poor  R wave progression, no acute changes BMET    Component Value Date/Time   NA 143 06/11/2010 1011   K 4.3 06/11/2010 1011   CL 106 06/11/2010 1011   CO2 32 06/11/2010 1011   GLUCOSE 77 06/11/2010 1011   BUN 24* 06/11/2010 1011   CREATININE 1.1 06/11/2010 1011   CALCIUM 10.5 06/11/2010 1011   GFRNONAA 47* 10/13/2009 0620   GFRAA  Value: 57        The eGFR  has been calculated using the MDRD equation. This calculation has not been validated in all clinical situations. eGFR's persistently <60 mL/min signify possible Chronic Kidney Disease.* 10/13/2009 0620    Lipid Panel     Component Value Date/Time   CHOL  Value: 105        ATP III CLASSIFICATION:  <200     mg/dL   Desirable  191-478  mg/dL   Borderline High  >=295    mg/dL   High        08/11/3084 0918   TRIG 114 10/10/2009 0918   HDL 41 10/10/2009 0918   CHOLHDL 2.6 10/10/2009 0918   VLDL 23 10/10/2009 0918   LDLCALC  Value: 41        Total Cholesterol/HDL:CHD Risk Coronary Heart Disease Risk Table                     Men   Women  1/2 Average Risk   3.4   3.3  Average Risk       5.0   4.4  2 X Average Risk   9.6   7.1  3 X Average Risk  23.4   11.0        Use the calculated Patient Ratio above and the CHD Risk Table to determine the  patient's CHD Risk.        ATP III CLASSIFICATION (LDL):  <100     mg/dL   Optimal  578-469  mg/dL   Near or Above                    Optimal  130-159  mg/dL   Borderline  629-528  mg/dL   High  >413     mg/dL   Very High 2/44/0102 7253    CBC    Component Value Date/Time   WBC 5.2 10/13/2009 0620   RBC 4.23 10/13/2009 0620   HGB 12.5 10/13/2009 0620   HCT 38.4 10/13/2009 0620   PLT 238 10/13/2009 0620   MCV 90.8 10/13/2009 0620   MCH 29.6 10/13/2009 0620   MCHC 32.6 10/13/2009 0620   RDW 13.0 10/13/2009 0620   LYMPHSABS 0.7 10/09/2009 1554   MONOABS 0.8 10/09/2009 1554   EOSABS 0.1 10/09/2009 1554   BASOSABS 0.0 10/09/2009 1554

## 2011-06-14 DIAGNOSIS — L570 Actinic keratosis: Secondary | ICD-10-CM | POA: Diagnosis not present

## 2011-06-14 DIAGNOSIS — L821 Other seborrheic keratosis: Secondary | ICD-10-CM | POA: Diagnosis not present

## 2011-06-14 DIAGNOSIS — D235 Other benign neoplasm of skin of trunk: Secondary | ICD-10-CM | POA: Diagnosis not present

## 2011-07-12 DIAGNOSIS — L570 Actinic keratosis: Secondary | ICD-10-CM | POA: Diagnosis not present

## 2011-09-27 DIAGNOSIS — R21 Rash and other nonspecific skin eruption: Secondary | ICD-10-CM | POA: Diagnosis not present

## 2011-12-14 DIAGNOSIS — E7800 Pure hypercholesterolemia, unspecified: Secondary | ICD-10-CM | POA: Diagnosis not present

## 2011-12-16 DIAGNOSIS — Z23 Encounter for immunization: Secondary | ICD-10-CM | POA: Diagnosis not present

## 2011-12-16 DIAGNOSIS — I10 Essential (primary) hypertension: Secondary | ICD-10-CM | POA: Diagnosis not present

## 2011-12-16 DIAGNOSIS — E7800 Pure hypercholesterolemia, unspecified: Secondary | ICD-10-CM | POA: Diagnosis not present

## 2012-06-26 DIAGNOSIS — E7800 Pure hypercholesterolemia, unspecified: Secondary | ICD-10-CM | POA: Diagnosis not present

## 2012-06-29 DIAGNOSIS — I10 Essential (primary) hypertension: Secondary | ICD-10-CM | POA: Diagnosis not present

## 2012-06-29 DIAGNOSIS — Z6826 Body mass index (BMI) 26.0-26.9, adult: Secondary | ICD-10-CM | POA: Diagnosis not present

## 2012-06-29 DIAGNOSIS — Z1331 Encounter for screening for depression: Secondary | ICD-10-CM | POA: Diagnosis not present

## 2012-07-05 ENCOUNTER — Encounter: Payer: Self-pay | Admitting: *Deleted

## 2012-07-13 ENCOUNTER — Ambulatory Visit (INDEPENDENT_AMBULATORY_CARE_PROVIDER_SITE_OTHER): Payer: Medicare Other | Admitting: Cardiology

## 2012-07-13 ENCOUNTER — Encounter: Payer: Self-pay | Admitting: Cardiology

## 2012-07-13 VITALS — BP 128/76 | HR 73 | Ht 65.0 in | Wt 150.4 lb

## 2012-07-13 DIAGNOSIS — I10 Essential (primary) hypertension: Secondary | ICD-10-CM | POA: Diagnosis not present

## 2012-07-13 DIAGNOSIS — R609 Edema, unspecified: Secondary | ICD-10-CM | POA: Diagnosis not present

## 2012-07-13 DIAGNOSIS — I2510 Atherosclerotic heart disease of native coronary artery without angina pectoris: Secondary | ICD-10-CM

## 2012-07-13 DIAGNOSIS — E785 Hyperlipidemia, unspecified: Secondary | ICD-10-CM

## 2012-07-13 DIAGNOSIS — R600 Localized edema: Secondary | ICD-10-CM

## 2012-07-13 NOTE — Progress Notes (Signed)
HPI Kendra Stevenson returns today for the evaluation and management of her history of coronary disease and lower extremity edema. She remains remarkably independent and continues to drive. She's very compliant with her medications. Primary care following her blood work.  She denies any angina or anginal equivalence. Her edema has been stable. She denies orthopnea or PND.  Past Medical History  Diagnosis Date  . CAD 10/20/2009  . HYPERTENSION 10/20/2009  . HYPERLIPIDEMIA 10/20/2009    Current Outpatient Prescriptions  Medication Sig Dispense Refill  . amLODipine (NORVASC) 5 MG tablet Take 5 mg by mouth daily.      Marland Kitchen aspirin 81 MG tablet Take 81 mg by mouth daily.        . calcium citrate-vitamin D (CITRACAL+D) 315-200 MG-UNIT per tablet Take 1 tablet by mouth daily.        Marland Kitchen lisinopril (PRINIVIL,ZESTRIL) 40 MG tablet Take 40 mg by mouth daily.        . Multiple Vitamin (MULTIVITAMIN) tablet Take 1 tablet by mouth daily.        . nitroGLYCERIN (NITROSTAT) 0.4 MG SL tablet Place 0.4 mg under the tongue every 5 (five) minutes as needed.        . pravastatin (PRAVACHOL) 40 MG tablet Take 40 mg by mouth daily.         No current facility-administered medications for this visit.    Allergies  Allergen Reactions  . Bactrim   . Sulfamethoxazole W-Trimethoprim     REACTION: rash    No family history on file.  History   Social History  . Marital Status: Widowed    Spouse Name: N/A    Number of Children: N/A  . Years of Education: N/A   Occupational History  . Not on file.   Social History Main Topics  . Smoking status: Never Smoker   . Smokeless tobacco: Not on file  . Alcohol Use: No  . Drug Use: No  . Sexually Active: Not on file   Other Topics Concern  . Not on file   Social History Narrative  . No narrative on file    ROS ALL NEGATIVE EXCEPT THOSE NOTED IN HPI  PE  General Appearance: well developed, well nourished in no acute distress, obese HEENT: symmetrical face,  PERRLA, good dentition  Neck: no JVD, thyromegaly, or adenopathy, trachea midline Chest: symmetric without deformity Cardiac: PMI non-displaced, RRR, normal S1, S2, no gallop or murmur Lung: clear to ausculation and percussion Vascular: all pulses full without bruits  Abdominal: nondistended, nontender, good bowel sounds, no HSM, no bruits Extremities: no cyanosis, clubbing, minimal pitting edema, no sign of DVT, superficial varicosities, arthritic changes in her hand  Skin: normal color, no rashes Neuro: alert and oriented x 3, non-focal Pysch: normal affect  EKG Normal sinus rhythm, normal EKG BMET    Component Value Date/Time   NA 143 06/11/2010 1011   K 4.3 06/11/2010 1011   CL 106 06/11/2010 1011   CO2 32 06/11/2010 1011   GLUCOSE 77 06/11/2010 1011   BUN 24* 06/11/2010 1011   CREATININE 1.1 06/11/2010 1011   CALCIUM 10.5 06/11/2010 1011   GFRNONAA 47* 10/13/2009 0620   GFRAA  Value: 57        The eGFR has been calculated using the MDRD equation. This calculation has not been validated in all clinical situations. eGFR's persistently <60 mL/min signify possible Chronic Kidney Disease.* 10/13/2009 7829    Lipid Panel     Component Value Date/Time  CHOL  Value: 105        ATP III CLASSIFICATION:  <200     mg/dL   Desirable  161-096  mg/dL   Borderline High  >=045    mg/dL   High        05/30/8117 0918   TRIG 114 10/10/2009 0918   HDL 41 10/10/2009 0918   CHOLHDL 2.6 10/10/2009 0918   VLDL 23 10/10/2009 0918   LDLCALC  Value: 41        Total Cholesterol/HDL:CHD Risk Coronary Heart Disease Risk Table                     Men   Women  1/2 Average Risk   3.4   3.3  Average Risk       5.0   4.4  2 X Average Risk   9.6   7.1  3 X Average Risk  23.4   11.0        Use the calculated Patient Ratio above and the CHD Risk Table to determine the patient's CHD Risk.        ATP III CLASSIFICATION (LDL):  <100     mg/dL   Optimal  147-829  mg/dL   Near or Above                    Optimal  130-159  mg/dL    Borderline  562-130  mg/dL   High  >865     mg/dL   Very High 7/84/6962 9528    CBC    Component Value Date/Time   WBC 5.2 10/13/2009 0620   RBC 4.23 10/13/2009 0620   HGB 12.5 10/13/2009 0620   HCT 38.4 10/13/2009 0620   PLT 238 10/13/2009 0620   MCV 90.8 10/13/2009 0620   MCH 29.6 10/13/2009 0620   MCHC 32.6 10/13/2009 0620   RDW 13.0 10/13/2009 0620   LYMPHSABS 0.7 10/09/2009 1554   MONOABS 0.8 10/09/2009 1554   EOSABS 0.1 10/09/2009 1554   BASOSABS 0.0 10/09/2009 1554

## 2012-07-13 NOTE — Assessment & Plan Note (Signed)
Stable. Continue secondary preventative therapy. Return the office again in one year with Dr. Delton See.

## 2012-07-13 NOTE — Patient Instructions (Addendum)
Your physician recommends that you continue on your current medications as directed. Please refer to the Current Medication list given to you today.  Your physician wants you to follow-up in: 1 year with Dr. Nelson. You will receive a reminder letter in the mail two months in advance. If you don't receive a letter, please call our office to schedule the follow-up appointment.  

## 2012-07-23 ENCOUNTER — Other Ambulatory Visit: Payer: Self-pay | Admitting: *Deleted

## 2012-07-23 MED ORDER — NITROGLYCERIN 0.4 MG SL SUBL: 0.4000 mg | SUBLINGUAL_TABLET | SUBLINGUAL | Status: AC | PRN

## 2012-07-23 NOTE — Telephone Encounter (Signed)
Fax Received. Refill Completed. Kendra Stevenson (R.M.A)   

## 2012-08-02 DIAGNOSIS — Z1231 Encounter for screening mammogram for malignant neoplasm of breast: Secondary | ICD-10-CM | POA: Diagnosis not present

## 2012-12-31 DIAGNOSIS — E7800 Pure hypercholesterolemia, unspecified: Secondary | ICD-10-CM | POA: Diagnosis not present

## 2013-01-02 DIAGNOSIS — Z23 Encounter for immunization: Secondary | ICD-10-CM | POA: Diagnosis not present

## 2013-01-02 DIAGNOSIS — E7800 Pure hypercholesterolemia, unspecified: Secondary | ICD-10-CM | POA: Diagnosis not present

## 2013-01-02 DIAGNOSIS — I10 Essential (primary) hypertension: Secondary | ICD-10-CM | POA: Diagnosis not present

## 2013-01-28 DIAGNOSIS — T148 Other injury of unspecified body region: Secondary | ICD-10-CM | POA: Diagnosis not present

## 2013-05-22 DIAGNOSIS — D0439 Carcinoma in situ of skin of other parts of face: Secondary | ICD-10-CM | POA: Diagnosis not present

## 2013-05-22 DIAGNOSIS — C4401 Basal cell carcinoma of skin of lip: Secondary | ICD-10-CM | POA: Diagnosis not present

## 2013-05-22 DIAGNOSIS — B009 Herpesviral infection, unspecified: Secondary | ICD-10-CM | POA: Diagnosis not present

## 2013-05-22 DIAGNOSIS — C44319 Basal cell carcinoma of skin of other parts of face: Secondary | ICD-10-CM | POA: Diagnosis not present

## 2013-05-22 DIAGNOSIS — D0430 Carcinoma in situ of skin of unspecified part of face: Secondary | ICD-10-CM | POA: Diagnosis not present

## 2013-05-22 DIAGNOSIS — L570 Actinic keratosis: Secondary | ICD-10-CM | POA: Diagnosis not present

## 2013-06-14 ENCOUNTER — Encounter: Payer: Self-pay | Admitting: *Deleted

## 2013-06-25 DIAGNOSIS — Z85828 Personal history of other malignant neoplasm of skin: Secondary | ICD-10-CM | POA: Diagnosis not present

## 2013-07-02 ENCOUNTER — Encounter: Payer: Self-pay | Admitting: Cardiology

## 2013-07-02 DIAGNOSIS — E7800 Pure hypercholesterolemia, unspecified: Secondary | ICD-10-CM | POA: Diagnosis not present

## 2013-07-02 DIAGNOSIS — Z79899 Other long term (current) drug therapy: Secondary | ICD-10-CM | POA: Diagnosis not present

## 2013-07-04 DIAGNOSIS — I10 Essential (primary) hypertension: Secondary | ICD-10-CM | POA: Diagnosis not present

## 2013-07-04 DIAGNOSIS — E7800 Pure hypercholesterolemia, unspecified: Secondary | ICD-10-CM | POA: Diagnosis not present

## 2013-07-04 DIAGNOSIS — N183 Chronic kidney disease, stage 3 unspecified: Secondary | ICD-10-CM | POA: Diagnosis not present

## 2013-07-16 ENCOUNTER — Encounter: Payer: Self-pay | Admitting: Cardiology

## 2013-07-16 ENCOUNTER — Ambulatory Visit (INDEPENDENT_AMBULATORY_CARE_PROVIDER_SITE_OTHER): Payer: Medicare Other | Admitting: Cardiology

## 2013-07-16 VITALS — BP 126/78 | HR 75 | Ht 65.0 in | Wt 148.0 lb

## 2013-07-16 DIAGNOSIS — R609 Edema, unspecified: Secondary | ICD-10-CM

## 2013-07-16 DIAGNOSIS — R600 Localized edema: Secondary | ICD-10-CM

## 2013-07-16 DIAGNOSIS — I2510 Atherosclerotic heart disease of native coronary artery without angina pectoris: Secondary | ICD-10-CM

## 2013-07-16 DIAGNOSIS — E785 Hyperlipidemia, unspecified: Secondary | ICD-10-CM | POA: Diagnosis not present

## 2013-07-16 DIAGNOSIS — I10 Essential (primary) hypertension: Secondary | ICD-10-CM | POA: Diagnosis not present

## 2013-07-16 MED ORDER — AMLODIPINE BESYLATE 2.5 MG PO TABS: 2.5000 mg | ORAL_TABLET | Freq: Every day | ORAL | Status: DC

## 2013-07-16 MED ORDER — HYDROCHLOROTHIAZIDE 25 MG PO TABS: 25.0000 mg | ORAL_TABLET | Freq: Every day | ORAL | Status: DC

## 2013-07-16 NOTE — Patient Instructions (Signed)
Your physician has recommended you make the following change in your medication:   CUT YOUR AMLODIPINE IN HALF BY TAKING AMLODIPINE 2.5 MG PO DAILY  START TAKING HYDROCHLOROTHIAZIDE 25 MG DAILY   Your physician recommends that you schedule a follow-up appointment in: [place]

## 2013-07-16 NOTE — Progress Notes (Signed)
Patient ID: Kendra Stevenson, female   DOB: 1927/01/28, 80 y.o.   MRN: [id number]     Patient Name: Kendra Stevenson Date of Encounter: 07/16/2013  Primary Care Provider:  Leonides Sake, MD Primary Cardiologist:  Dorothy Spark, previously Dr. Verl Blalock  Problem List   Past Medical History  Diagnosis Date  . CAD 10/20/2009  . HYPERTENSION 10/20/2009  . HYPERLIPIDEMIA 10/20/2009   Past Surgical History  Procedure Laterality Date  . Knee arthroscopy Right 2001  . Left heart cath  09/2009    Allergies  Allergies  Allergen Reactions  . Bactrim   . Sulfamethoxazole-Trimethoprim     REACTION: rash   HPI  This is an 80 year old white female with NSTEMI and PCI/LAD in 2011. She had diffuse 30% stenosis throughout the proximal mid RCA and 20% plaque in the proximal circumflex ejection fraction was 60%. She was not placed on a beta blocker because of her bradycardia.  She is coming after one year and reports that she has been doing nerve file. She denies any chest pain, shortness of breath, palpitations or syncope. Her only complaint is lower extremity edema that has been long-standing. She denies any claudications or orthopnea. She is still driving and is very independent life. She has sublingual nitroglycerin but never had to use it. Her cholesterol is followed by her primary care physician  Home Medications  Prior to Admission medications   Medication Sig Start Date End Date Taking? Authorizing Provider  amLODipine (NORVASC) 5 MG tablet Take 5 mg by mouth daily.   Yes Historical Provider, MD  aspirin 81 MG tablet Take 81 mg by mouth daily.     Yes Historical Provider, MD  calcium citrate-vitamin D (CITRACAL+D) 315-200 MG-UNIT per tablet Take 1 tablet by mouth daily.     Yes Historical Provider, MD  lisinopril (PRINIVIL,ZESTRIL) 40 MG tablet Take 40 mg by mouth daily.     Yes Historical Provider, MD  Multiple Vitamin (MULTIVITAMIN) tablet Take 1 tablet by mouth daily.     Yes Historical  Provider, MD  nitroGLYCERIN (NITROSTAT) 0.4 MG SL tablet Place 1 tablet (0.4 mg total) under the tongue every 5 (five) minutes as needed. 07/23/12  Yes Renella Cunas, MD  pravastatin (PRAVACHOL) 40 MG tablet Take 40 mg by mouth daily.     Yes Historical Provider, MD    Family History  No family history on file.  Social History  History   Social History  . Marital Status: Widowed    Spouse Name: N/A    Number of Children: N/A  . Years of Education: N/A   Occupational History  . Not on file.   Social History Main Topics  . Smoking status: Never Smoker   . Smokeless tobacco: Not on file  . Alcohol Use: No  . Drug Use: No  . Sexual Activity: Not on file   Other Topics Concern  . Not on file   Social History Narrative  . No narrative on file     Review of Systems, as per HPI, otherwise negative General:  No chills, fever, night sweats or weight changes.  Cardiovascular:  No chest pain, dyspnea on exertion, edema, orthopnea, palpitations, paroxysmal nocturnal dyspnea. Dermatological: No rash, lesions/masses Respiratory: No cough, dyspnea Urologic: No hematuria, dysuria Abdominal:   No nausea, vomiting, diarrhea, bright red blood per rectum, melena, or hematemesis Neurologic:  No visual changes, wkns, changes in mental status. All other systems reviewed and are otherwise negative except as noted above.  Physical Exam  Blood pressure 126/78, pulse 75, height 5' 5"  (1.651 m), weight 148 lb (67.132 kg).  General: Pleasant, NAD Psych: Normal affect. Neuro: Alert and oriented X 3. Moves all extremities spontaneously. HEENT: Normal  Neck: Supple without bruits or JVD. Lungs:  Resp regular and unlabored, CTA. Heart: RRR no s3, s4, or murmurs. Abdomen: Soft, non-tender, non-distended, BS + x 4.  Extremities: No clubbing, cyanosis, 1+ lower extremity edema. DP/PT/Radials 2+ and equal bilaterally.  Labs:  No results found for this basename: CKTOTAL, CKMB, TROPONINI,  in the  last 72 hours Lab Results  Component Value Date   WBC 5.2 10/13/2009   HGB 12.5 10/13/2009   HCT 38.4 10/13/2009   MCV 90.8 10/13/2009   PLT 238 10/13/2009    No results found for this basename: DDIMER   No components found with this basename: POCBNP,     Component Value Date/Time   NA 143 06/11/2010 1011   K 4.3 06/11/2010 1011   CL 106 06/11/2010 1011   CO2 32 06/11/2010 1011   GLUCOSE 77 06/11/2010 1011   BUN 24* 06/11/2010 1011   CREATININE 1.1 06/11/2010 1011   CALCIUM 10.5 06/11/2010 1011   PROT 5.7* 10/10/2009 0918   ALBUMIN 2.5* 10/10/2009 0918   AST 20 10/10/2009 0918   ALT 13 10/10/2009 0918   ALKPHOS 85 10/10/2009 0918   BILITOT 0.4 10/10/2009 0918   GFRNONAA 47* 10/13/2009 0620   GFRAA  Value: 57        The eGFR has been calculated using the MDRD equation. This calculation has not been validated in all clinical situations. eGFR's persistently <60 mL/min signify possible Chronic Kidney Disease.* 10/13/2009 7425   Lab Results  Component Value Date   CHOL  Value: 105        ATP III CLASSIFICATION:  <200     mg/dL   Desirable  200-239  mg/dL   Borderline High  >=240    mg/dL   High        10/10/2009   HDL 41 10/10/2009   LDLCALC  Value: 41        Total Cholesterol/HDL:CHD Risk Coronary Heart Disease Risk Table                     Men   Women  1/2 Average Risk   3.4   3.3  Average Risk       5.0   4.4  2 X Average Risk   9.6   7.1  3 X Average Risk  23.4   11.0        Use the calculated Patient Ratio above and the CHD Risk Table to determine the patient's CHD Risk.        ATP III CLASSIFICATION (LDL):  <100     mg/dL   Optimal  100-129  mg/dL   Near or Above                    Optimal  130-159  mg/dL   Borderline  160-189  mg/dL   High  >190     mg/dL   Very High 10/10/2009   TRIG 114 10/10/2009    Accessory Clinical Findings  Echocardiogram - 10/10/2012 - Left ventricle: The cavity size was normal. Wall thickness was normal. Systolic function was normal. The estimated ejection fraction was  in the range of 55% to 60%. Wall motion was normal; there were no regional wall motion abnormalities. - Aortic valve:  Trivial regurgitation. - Atrial septum: No defect or patent foramen ovale was identified. - Pulmonary arteries: PA peak pressure: 13m Hg (S).  ECG - sinus rhythm with PVCs. Prior anterior infarct, age undetermined. Unchanged from EKG on Jul 13, 2012.    Assessment & Plan 80year old female  1. CAD - asymptomatic, no indication for ischemia testing at this point. Continue aspirin, lisinopril, statin, no beta blocker because of underlying bradycardia.  2. Hypertension - well controlled  3. lower extremity edema - we will decrease amlodipine to 2.5 mg daily, start hydrochlorothiazide 25 mg daily, and encouraged to further use of her compression stocking.  4. Hyperlipidemia - followed by primary care physician  Followup in 2 months   KDorothy Spark MD, FSpring [facility]5/26/2015, 4:16 PM

## 2013-07-17 ENCOUNTER — Encounter: Payer: Medicare Other | Admitting: Cardiology

## 2013-09-05 ENCOUNTER — Encounter: Payer: Self-pay | Admitting: *Deleted

## 2013-09-16 ENCOUNTER — Ambulatory Visit (INDEPENDENT_AMBULATORY_CARE_PROVIDER_SITE_OTHER): Payer: Medicare Other | Admitting: Cardiology

## 2013-09-16 ENCOUNTER — Encounter: Payer: Self-pay | Admitting: Cardiology

## 2013-09-16 VITALS — BP 102/58 | HR 84 | Ht 60.0 in | Wt 140.0 lb

## 2013-09-16 DIAGNOSIS — I10 Essential (primary) hypertension: Secondary | ICD-10-CM | POA: Diagnosis not present

## 2013-09-16 DIAGNOSIS — I2510 Atherosclerotic heart disease of native coronary artery without angina pectoris: Secondary | ICD-10-CM | POA: Diagnosis not present

## 2013-09-16 LAB — BASIC METABOLIC PANEL
BUN: 43 mg/dL — ABNORMAL HIGH (ref 6–23)
CO2: 30 mEq/L (ref 19–32)
Calcium: 10.5 mg/dL (ref 8.4–10.5)
Chloride: 106 mEq/L (ref 96–112)
Creatinine, Ser: 2 mg/dL — ABNORMAL HIGH (ref 0.4–1.2)
GFR: 25.44 mL/min — ABNORMAL LOW (ref >60.00)
Glucose, Bld: 98 mg/dL (ref 70–99)
Potassium: 4 mEq/L (ref 3.5–5.1)
Sodium: 143 mEq/L (ref 135–145)

## 2013-09-16 NOTE — Progress Notes (Signed)
Patient ID: Kendra Stevenson, female   DOB: [birth year], 80 y.o.   MRN: [id number]    Patient Name: Kendra Stevenson Date of Encounter: 09/16/2013  Primary Care Provider:  Leonides Sake, MD Primary Cardiologist:  Dorothy Spark, previously Dr. Verl Blalock  Problem List   Past Medical History  Diagnosis Date  . CAD 10/20/2009  . HYPERTENSION 10/20/2009  . HYPERLIPIDEMIA 10/20/2009   Past Surgical History  Procedure Laterality Date  . Knee arthroscopy Right 2001  . Left heart cath  09/2009   Allergies  Allergies  Allergen Reactions  . Bactrim   . Sulfamethoxazole-Trimethoprim     REACTION: rash   HPI  This is an 80 year old white female with NSTEMI and PCI/LAD in 2011. She had diffuse 30% stenosis throughout the proximal mid RCA and 20% plaque in the proximal circumflex ejection fraction was 60%. She was not placed on a beta blocker because of her bradycardia.  She is coming after one year and reports that she has been doing well. She denies any chest pain, shortness of breath, palpitations or syncope. Her only complaint is lower extremity edema that has been long-standing. She denies any claudications or orthopnea. She is still driving and is very independent life. She has sublingual nitroglycerin but never had to use it. Her cholesterol is followed by her primary care physician.  The patient is coming after 78 months, her lower extremity edema has improved but she feels a little bit off balance her blood pressure is on the lower side. No syncope, no chest pain or shortness of breath.  Home Medications  Prior to Admission medications   Medication Sig Start Date End Date Taking? Authorizing Provider  amLODipine (NORVASC) 5 MG tablet Take 5 mg by mouth daily.   Yes Historical Provider, MD  aspirin 81 MG tablet Take 81 mg by mouth daily.     Yes Historical Provider, MD  calcium citrate-vitamin D (CITRACAL+D) 315-200 MG-UNIT per tablet Take 1 tablet by mouth daily.     Yes Historical  Provider, MD  lisinopril (PRINIVIL,ZESTRIL) 40 MG tablet Take 40 mg by mouth daily.     Yes Historical Provider, MD  Multiple Vitamin (MULTIVITAMIN) tablet Take 1 tablet by mouth daily.     Yes Historical Provider, MD  nitroGLYCERIN (NITROSTAT) 0.4 MG SL tablet Place 1 tablet (0.4 mg total) under the tongue every 5 (five) minutes as needed. 07/23/12  Yes Renella Cunas, MD  pravastatin (PRAVACHOL) 40 MG tablet Take 40 mg by mouth daily.     Yes Historical Provider, MD    Family History  No family history on file.  Social History  History   Social History  . Marital Status: Widowed    Spouse Name: N/A    Number of Children: N/A  . Years of Education: N/A   Occupational History  . Not on file.   Social History Main Topics  . Smoking status: Never Smoker   . Smokeless tobacco: Not on file  . Alcohol Use: No  . Drug Use: No  . Sexual Activity: Not on file   Other Topics Concern  . Not on file   Social History Narrative  . No narrative on file     Review of Systems, as per HPI, otherwise negative General:  No chills, fever, night sweats or weight changes.  Cardiovascular:  No chest pain, dyspnea on exertion, edema, orthopnea, palpitations, paroxysmal nocturnal dyspnea. Dermatological: No rash, lesions/masses Respiratory: No cough, dyspnea Urologic: No hematuria, dysuria Abdominal:  No nausea, vomiting, diarrhea, bright red blood per rectum, melena, or hematemesis Neurologic:  No visual changes, wkns, changes in mental status. All other systems reviewed and are otherwise negative except as noted above.  Physical Exam  Blood pressure 102/58, pulse 84, height 5' (1.524 m), weight 140 lb (63.504 kg), SpO2 94.00%.  General: Pleasant, NAD Psych: Normal affect. Neuro: Alert and oriented X 3. Moves all extremities spontaneously. HEENT: Normal  Neck: Supple without bruits or JVD. Lungs:  Resp regular and unlabored, CTA. Heart: RRR no s3, s4, or murmurs. Abdomen: Soft,  non-tender, non-distended, BS + x 4.  Extremities: No clubbing, cyanosis, 1+ lower extremity edema. DP/PT/Radials 2+ and equal bilaterally.  Labs:  No results found for this basename: CKTOTAL, CKMB, TROPONINI,  in the last 72 hours Lab Results  Component Value Date   WBC 5.2 10/13/2009   HGB 12.5 10/13/2009   HCT 38.4 10/13/2009   MCV 90.8 10/13/2009   PLT 238 10/13/2009    No results found for this basename: DDIMER   No components found with this basename: POCBNP,     Component Value Date/Time   NA 143 06/11/2010 1011   K 4.3 06/11/2010 1011   CL 106 06/11/2010 1011   CO2 32 06/11/2010 1011   GLUCOSE 77 06/11/2010 1011   BUN 24* 06/11/2010 1011   CREATININE 1.1 06/11/2010 1011   CALCIUM 10.5 06/11/2010 1011   PROT 5.7* 10/10/2009 0918   ALBUMIN 2.5* 10/10/2009 0918   AST 20 10/10/2009 0918   ALT 13 10/10/2009 0918   ALKPHOS 85 10/10/2009 0918   BILITOT 0.4 10/10/2009 0918   GFRNONAA 47* 10/13/2009 0620   GFRAA  Value: 57        The eGFR has been calculated using the MDRD equation. This calculation has not been validated in all clinical situations. eGFR's persistently <60 mL/min signify possible Chronic Kidney Disease.* 10/13/2009 1308   Lab Results  Component Value Date   CHOL  Value: 105        ATP III CLASSIFICATION:  <200     mg/dL   Desirable  200-239  mg/dL   Borderline High  >=240    mg/dL   High        10/10/2009   HDL 41 10/10/2009   LDLCALC  Value: 41        Total Cholesterol/HDL:CHD Risk Coronary Heart Disease Risk Table                     Men   Women  1/2 Average Risk   3.4   3.3  Average Risk       5.0   4.4  2 X Average Risk   9.6   7.1  3 X Average Risk  23.4   11.0        Use the calculated Patient Ratio above and the CHD Risk Table to determine the patient's CHD Risk.        ATP III CLASSIFICATION (LDL):  <100     mg/dL   Optimal  100-129  mg/dL   Near or Above                    Optimal  130-159  mg/dL   Borderline  160-189  mg/dL   High  >190     mg/dL   Very High 10/10/2009    TRIG 114 10/10/2009    Accessory Clinical Findings  Echocardiogram - 10/10/2012 - Left ventricle: The cavity size  was normal. Wall thickness was normal. Systolic function was normal. The estimated ejection fraction was in the range of 55% to 60%. Wall motion was normal; there were no regional wall motion abnormalities. - Aortic valve: Trivial regurgitation. - Atrial septum: No defect or patent foramen ovale was identified. - Pulmonary arteries: PA peak pressure: 40m Hg (S).  ECG - sinus rhythm with PVCs. Prior anterior infarct, age undetermined. Unchanged from EKG on Jul 13, 2012.    Assessment & Plan  80year old female  1. CAD - asymptomatic, no indication for ischemia testing at this point. Continue aspirin, lisinopril, statin, no beta blocker because of underlying bradycardia.  2. Hypertension - rather low, we will stop HCTZ  3. Lower extremity edema -  amlodipine decreased to 2.5 mg daily at the last visit, started hydrochlorothiazide 25 mg daily, encouraged to further use of her compression stocking. Lower extremity edema has improved, we will check basic metabolic profile today, for now discontinue hydrochlorothiazide as blood pressure is low and patient feels off-balance.  4. Hyperlipidemia - followed by primary care physician  Followup in 6 months   Danee Soller, KJamse Belfast MD, FChildren[facility] Of [facility]7/27/2015, 2:27 PM

## 2013-09-16 NOTE — Patient Instructions (Addendum)
**Note De-Identified Betina Puckett Obfuscation** Your physician has recommended you make the following change in your medication: stop taking Hydrochlorothiazide  Your physician recommends that you return for lab work in: today  Your physician wants you to follow-up in: 6 months. You will receive a reminder letter in the mail two months in advance. If you don't receive a letter, please call our office to schedule the follow-up appointment.

## 2013-09-23 ENCOUNTER — Telehealth: Payer: Self-pay | Admitting: *Deleted

## 2013-09-23 ENCOUNTER — Telehealth: Payer: Self-pay | Admitting: Cardiology

## 2013-09-23 DIAGNOSIS — R600 Localized edema: Secondary | ICD-10-CM

## 2013-09-23 DIAGNOSIS — E785 Hyperlipidemia, unspecified: Secondary | ICD-10-CM

## 2013-09-23 DIAGNOSIS — I10 Essential (primary) hypertension: Secondary | ICD-10-CM

## 2013-09-23 DIAGNOSIS — I2510 Atherosclerotic heart disease of native coronary artery without angina pectoris: Secondary | ICD-10-CM

## 2013-09-23 NOTE — Telephone Encounter (Signed)
Pt and Daughter contacted about recent lab work.  Informed both parties that per Dr Meda Coffee the pt should drink plenty of fluids, especially when its hot.  Informed both parties that pt should discontinue her lisinopril and have repeat BMP in one week per Dr Meda Coffee.  Lab appt scheduled for 8/11 and med list updated.  Both parties verbalized understanding and agree with this plan.

## 2013-09-23 NOTE — Telephone Encounter (Signed)
Message copied by Nuala Alpha on Mon Sep 23, 2013  2:21 PM ------      Message from: Dorothy Spark      Created: Sun Sep 22, 2013 11:56 PM       Please call the patient to encourage her to drink plenty of fluids when is hot. She should stop lisinopril as well.       She will need another BMP in 1 week. Thank you,      KN ------

## 2013-09-23 NOTE — Telephone Encounter (Signed)
Follow up    Patient calling stating nurse call her today

## 2013-09-23 NOTE — Telephone Encounter (Signed)
Pt contacted to confirm lab appt for repeat BMP.  Informed pt that the appt is scheduled for 8/11 for repeat BMP.  Also informed pt again that she should d/c her lisinopril.  Informed pt to increase her intake of fluids.  Pt verbalized understanding and agrees with this plan.

## 2013-09-25 ENCOUNTER — Telehealth: Payer: Self-pay | Admitting: Cardiology

## 2013-09-25 NOTE — Telephone Encounter (Signed)
New message ° ° ° ° ° °Returning a nurses call °

## 2013-09-25 NOTE — Telephone Encounter (Signed)
Pt calling to confirm her lab appt for next week 8/11.

## 2013-09-30 DIAGNOSIS — I10 Essential (primary) hypertension: Secondary | ICD-10-CM | POA: Diagnosis not present

## 2013-10-01 ENCOUNTER — Other Ambulatory Visit: Payer: Medicare Other

## 2013-10-18 DIAGNOSIS — T6391XA Toxic effect of contact with unspecified venomous animal, accidental (unintentional), initial encounter: Secondary | ICD-10-CM | POA: Diagnosis not present

## 2013-10-18 DIAGNOSIS — R609 Edema, unspecified: Secondary | ICD-10-CM | POA: Diagnosis not present

## 2013-10-21 DIAGNOSIS — I10 Essential (primary) hypertension: Secondary | ICD-10-CM | POA: Diagnosis not present

## 2013-10-21 DIAGNOSIS — Z23 Encounter for immunization: Secondary | ICD-10-CM | POA: Diagnosis not present

## 2013-12-31 DIAGNOSIS — L570 Actinic keratosis: Secondary | ICD-10-CM | POA: Diagnosis not present

## 2013-12-31 DIAGNOSIS — Z08 Encounter for follow-up examination after completed treatment for malignant neoplasm: Secondary | ICD-10-CM | POA: Diagnosis not present

## 2013-12-31 DIAGNOSIS — Z85828 Personal history of other malignant neoplasm of skin: Secondary | ICD-10-CM | POA: Diagnosis not present

## 2013-12-31 DIAGNOSIS — X32XXXD Exposure to sunlight, subsequent encounter: Secondary | ICD-10-CM | POA: Diagnosis not present

## 2014-01-07 DIAGNOSIS — E780 Pure hypercholesterolemia: Secondary | ICD-10-CM | POA: Diagnosis not present

## 2014-01-07 DIAGNOSIS — N183 Chronic kidney disease, stage 3 (moderate): Secondary | ICD-10-CM | POA: Diagnosis not present

## 2014-01-10 DIAGNOSIS — N183 Chronic kidney disease, stage 3 (moderate): Secondary | ICD-10-CM | POA: Diagnosis not present

## 2014-01-10 DIAGNOSIS — Z23 Encounter for immunization: Secondary | ICD-10-CM | POA: Diagnosis not present

## 2014-01-10 DIAGNOSIS — E780 Pure hypercholesterolemia: Secondary | ICD-10-CM | POA: Diagnosis not present

## 2014-01-10 DIAGNOSIS — I10 Essential (primary) hypertension: Secondary | ICD-10-CM | POA: Diagnosis not present

## 2014-03-26 ENCOUNTER — Ambulatory Visit (INDEPENDENT_AMBULATORY_CARE_PROVIDER_SITE_OTHER): Payer: Medicare Other | Admitting: Cardiology

## 2014-03-26 VITALS — BP 170/94 | HR 80 | Ht 60.0 in | Wt 147.0 lb

## 2014-03-26 DIAGNOSIS — I10 Essential (primary) hypertension: Secondary | ICD-10-CM | POA: Diagnosis not present

## 2014-03-26 DIAGNOSIS — I2510 Atherosclerotic heart disease of native coronary artery without angina pectoris: Secondary | ICD-10-CM

## 2014-03-26 DIAGNOSIS — I2583 Coronary atherosclerosis due to lipid rich plaque: Principal | ICD-10-CM

## 2014-03-26 MED ORDER — AMLODIPINE BESYLATE 5 MG PO TABS: 5.0000 mg | ORAL_TABLET | Freq: Every day | ORAL | Status: DC

## 2014-03-26 NOTE — Progress Notes (Signed)
Stevenson ID: Kendra Stevenson, female   DOB: [birth year], 81 y.o.   MRN: [id number] Stevenson ID: Kendra Stevenson, female   DOB: [birth year], 81 y.o.   MRN: [id number]    Stevenson Name: Kendra Stevenson Date of Encounter: 03/26/2014  Primary Care Provider:  Leonides Sake, MD Primary Cardiologist:  Dorothy Spark, previously Dr. Verl Blalock  Problem List   Past Medical History  Diagnosis Date  . CAD 10/20/2009  . HYPERTENSION 10/20/2009  . HYPERLIPIDEMIA 10/20/2009   Past Surgical History  Procedure Laterality Date  . Knee arthroscopy Right 2001  . Left heart cath  09/2009   Allergies  Allergies  Allergen Reactions  . Bactrim   . Sulfamethoxazole-Trimethoprim     REACTION: rash   HPI  This is an 81 year old white female with NSTEMI and PCI/LAD in 2011. She had diffuse 30% stenosis throughout Kendra proximal mid RCA and 20% plaque in Kendra proximal circumflex ejection fraction was 60%. She was not placed on a beta blocker because of her bradycardia.  She is coming after one year and reports that she has been doing well. She denies any chest pain, shortness of breath, palpitations or syncope. Her only complaint is lower extremity edema that has been long-standing. She denies any claudications or orthopnea. She is still driving and is very independent life. She has sublingual nitroglycerin but never had to use it. Her cholesterol is followed by her primary care physician.  Kendra Stevenson is coming after 2 months, her lower extremity edema has improved but she feels a little bit off balance her blood pressure is on Kendra lower side. No syncope, no chest pain or shortness of breath.  03/26/2014 - Kendra Stevenson denies any symptoms, she has no chest pain, SOB, claudications, palpitations or syncope. She is fairly active at home and not limited. She follow with her PCP for labs.  Home Medications  Prior to Admission medications   Medication Sig Start Date End Date Taking? Authorizing Provider  amLODipine (NORVASC)  5 MG tablet Take 5 mg by mouth daily.   Yes Historical Provider, MD  aspirin 81 MG tablet Take 81 mg by mouth daily.     Yes Historical Provider, MD  calcium citrate-vitamin D (CITRACAL+D) 315-200 MG-UNIT per tablet Take 1 tablet by mouth daily.     Yes Historical Provider, MD  lisinopril (PRINIVIL,ZESTRIL) 40 MG tablet Take 40 mg by mouth daily.     Yes Historical Provider, MD  Multiple Vitamin (MULTIVITAMIN) tablet Take 1 tablet by mouth daily.     Yes Historical Provider, MD  nitroGLYCERIN (NITROSTAT) 0.4 MG SL tablet Place 1 tablet (0.4 mg total) under Kendra tongue every 5 (five) minutes as needed. 07/23/12  Yes Renella Cunas, MD  pravastatin (PRAVACHOL) 40 MG tablet Take 40 mg by mouth daily.     Yes Historical Provider, MD    Family History  No family history on file.  Social History  History   Social History  . Marital Status: Widowed    Spouse Name: N/A    Number of Children: N/A  . Years of Education: N/A   Occupational History  . Not on file.   Social History Main Topics  . Smoking status: Never Smoker   . Smokeless tobacco: Not on file  . Alcohol Use: No  . Drug Use: No  . Sexual Activity: Not on file   Other Topics Concern  . Not on file   Social History Narrative  . No narrative on file  Review of Systems, as per HPI, otherwise negative General:  No chills, fever, night sweats or weight changes.  Cardiovascular:  No chest pain, dyspnea on exertion, edema, orthopnea, palpitations, paroxysmal nocturnal dyspnea. Dermatological: No rash, lesions/masses Respiratory: No cough, dyspnea Urologic: No hematuria, dysuria Abdominal:   No nausea, vomiting, diarrhea, bright red blood per rectum, melena, or hematemesis Neurologic:  No visual changes, wkns, changes in mental status. All other systems reviewed and are otherwise negative except as noted above.  Physical Exam  Blood pressure 170/94, pulse 80, height 5' (1.524 m), weight 147 lb (66.679 kg), SpO2 97 %.    General: Pleasant, NAD Psych: Normal affect. Neuro: Alert and oriented X 3. Moves all extremities spontaneously. HEENT: Normal  Neck: Supple without bruits or JVD. Lungs:  Resp regular and unlabored, CTA. Heart: RRR no s3, s4, or murmurs. Abdomen: Soft, non-tender, non-distended, BS + x 4.  Extremities: No clubbing, cyanosis, 1+ lower extremity edema. DP/PT/Radials 2+ and equal bilaterally.  Labs:  No results for input(s): CKTOTAL, CKMB, TROPONINI in Kendra last 72 hours. Lab Results  Component Value Date   WBC 5.2 10/13/2009   HGB 12.5 10/13/2009   HCT 38.4 10/13/2009   MCV 90.8 10/13/2009   PLT 238 10/13/2009    No results found for: DDIMER Invalid input(s): POCBNP    Component Value Date/Time   NA 143 09/16/2013 1445   K 4.0 09/16/2013 1445   CL 106 09/16/2013 1445   CO2 30 09/16/2013 1445   GLUCOSE 98 09/16/2013 1445   BUN 43* 09/16/2013 1445   CREATININE 2.0* 09/16/2013 1445   CALCIUM 10.5 09/16/2013 1445   PROT 5.7* 10/10/2009 0918   ALBUMIN 2.5* 10/10/2009 0918   AST 20 10/10/2009 0918   ALT 13 10/10/2009 0918   ALKPHOS 85 10/10/2009 0918   BILITOT 0.4 10/10/2009 0918   GFRNONAA 47* 10/13/2009 0620   GFRAA * 10/13/2009 0620    57        Kendra eGFR has been calculated using Kendra MDRD equation. This calculation has not been validated in all clinical situations. eGFR's persistently <60 mL/min signify possible Chronic Kidney Disease.   Lab Results  Component Value Date   CHOL  10/10/2009    105        ATP III CLASSIFICATION:  <200     mg/dL   Desirable  200-239  mg/dL   Borderline High  >=240    mg/dL   High          HDL 41 10/10/2009   LDLCALC  10/10/2009    41        Total Cholesterol/HDL:CHD Risk Coronary Heart Disease Risk Table                     Men   Women  1/2 Average Risk   3.4   3.3  Average Risk       5.0   4.4  2 X Average Risk   9.6   7.1  3 X Average Risk  23.4   11.0        Use Kendra calculated Stevenson Ratio above and Kendra CHD Risk  Table to determine Kendra Stevenson's CHD Risk.        ATP III CLASSIFICATION (LDL):  <100     mg/dL   Optimal  100-129  mg/dL   Near or Above                    Optimal  130-159  mg/dL   Borderline  160-189  mg/dL   High  >190     mg/dL   Very High   TRIG 114 10/10/2009    Accessory Clinical Findings  Echocardiogram - 10/10/2012 - Left ventricle: Kendra cavity size was normal. Wall thickness was normal. Systolic function was normal. Kendra estimated ejection fraction was in Kendra range of 55% to 60%. Wall motion was normal; there were no regional wall motion abnormalities. - Aortic valve: Trivial regurgitation. - Atrial septum: No defect or patent foramen ovale was identified. - Pulmonary arteries: PA peak pressure: 75m Hg (S).  ECG - sinus rhythm with PVCs. Prior anterior infarct, age undetermined. Unchanged from EKG on Jul 13, 2012.    Assessment & Plan  81year old female  1. CAD - asymptomatic, no indication for ischemia testing at this point. Continue aspirin, lisinopril, statin, no beta blocker because of underlying bradycardia.  2. Hypertension - uncontrolled, incresae amlodipine to 5 mg po daily.  3. Lower extremity edema -  encouraged to further use of her compression stocking. HCTZ was discontinued as she has Crea 1.1 --> 2.0.   4. Hyperlipidemia - followed by primary care physician  5. Acute kidney failure - Kendra Stevenson was advised to drink more fluids, she doesn't want to have labs done here as her PCP is doing it.  Followup in 1 year.   NDorothy Spark MD, FKind[facility]2/04/2014, 10:07 AM

## 2014-03-26 NOTE — Patient Instructions (Signed)
Your physician has recommended you make the following change in your medication:   INCREASE YOUR AMLODIPINE TO 5 MG ONCE DAILY   Your physician recommends that you schedule a follow-up appointment in: WITH YOUR PCP IN ONE MONTH   Your physician wants you to follow-up in: [place] will receive a reminder letter in the mail two months in advance. If you don't receive a letter, please call our office to schedule the follow-up appointment.

## 2014-07-11 DIAGNOSIS — E780 Pure hypercholesterolemia: Secondary | ICD-10-CM | POA: Diagnosis not present

## 2014-07-11 DIAGNOSIS — N183 Chronic kidney disease, stage 3 (moderate): Secondary | ICD-10-CM | POA: Diagnosis not present

## 2014-07-15 DIAGNOSIS — I10 Essential (primary) hypertension: Secondary | ICD-10-CM | POA: Diagnosis not present

## 2014-07-15 DIAGNOSIS — Z9181 History of falling: Secondary | ICD-10-CM | POA: Diagnosis not present

## 2014-07-15 DIAGNOSIS — E780 Pure hypercholesterolemia: Secondary | ICD-10-CM | POA: Diagnosis not present

## 2014-07-15 DIAGNOSIS — N183 Chronic kidney disease, stage 3 (moderate): Secondary | ICD-10-CM | POA: Diagnosis not present

## 2014-07-15 DIAGNOSIS — Z6828 Body mass index (BMI) 28.0-28.9, adult: Secondary | ICD-10-CM | POA: Diagnosis not present

## 2014-07-15 DIAGNOSIS — Z1389 Encounter for screening for other disorder: Secondary | ICD-10-CM | POA: Diagnosis not present

## 2015-01-07 DIAGNOSIS — N183 Chronic kidney disease, stage 3 (moderate): Secondary | ICD-10-CM | POA: Diagnosis not present

## 2015-01-07 DIAGNOSIS — E7800 Pure hypercholesterolemia, unspecified: Secondary | ICD-10-CM | POA: Diagnosis not present

## 2015-01-12 DIAGNOSIS — Z23 Encounter for immunization: Secondary | ICD-10-CM | POA: Diagnosis not present

## 2015-01-12 DIAGNOSIS — N183 Chronic kidney disease, stage 3 (moderate): Secondary | ICD-10-CM | POA: Diagnosis not present

## 2015-01-12 DIAGNOSIS — I10 Essential (primary) hypertension: Secondary | ICD-10-CM | POA: Diagnosis not present

## 2015-01-12 DIAGNOSIS — Z6828 Body mass index (BMI) 28.0-28.9, adult: Secondary | ICD-10-CM | POA: Diagnosis not present

## 2015-01-12 DIAGNOSIS — Z1389 Encounter for screening for other disorder: Secondary | ICD-10-CM | POA: Diagnosis not present

## 2015-01-12 DIAGNOSIS — E7800 Pure hypercholesterolemia, unspecified: Secondary | ICD-10-CM | POA: Diagnosis not present

## 2015-06-25 DIAGNOSIS — H2512 Age-related nuclear cataract, left eye: Secondary | ICD-10-CM | POA: Diagnosis not present

## 2015-07-02 DIAGNOSIS — H2512 Age-related nuclear cataract, left eye: Secondary | ICD-10-CM | POA: Diagnosis not present

## 2015-07-03 ENCOUNTER — Encounter: Payer: Self-pay | Admitting: *Deleted

## 2015-07-05 NOTE — H&P (Signed)
See scanned note.

## 2015-07-06 ENCOUNTER — Encounter: Payer: Self-pay | Admitting: *Deleted

## 2015-07-06 ENCOUNTER — Ambulatory Visit
Admission: RE | Admit: 2015-07-06 | Discharge: 2015-07-06 | Disposition: A | Discharge status: Home / Self Care | Payer: Medicare Other | Source: Ambulatory Visit | Attending: Ophthalmology | Admitting: Ophthalmology

## 2015-07-06 ENCOUNTER — Ambulatory Visit: Payer: Medicare Other | Admitting: Anesthesiology

## 2015-07-06 ENCOUNTER — Encounter
Admission: RE | Disposition: A | Discharge status: Home / Self Care | Payer: Self-pay | Source: Ambulatory Visit | Attending: Ophthalmology

## 2015-07-06 DIAGNOSIS — Z9841 Cataract extraction status, right eye: Secondary | ICD-10-CM | POA: Insufficient documentation

## 2015-07-06 DIAGNOSIS — M7989 Other specified soft tissue disorders: Secondary | ICD-10-CM | POA: Insufficient documentation

## 2015-07-06 DIAGNOSIS — M1990 Unspecified osteoarthritis, unspecified site: Secondary | ICD-10-CM | POA: Insufficient documentation

## 2015-07-06 DIAGNOSIS — I2510 Atherosclerotic heart disease of native coronary artery without angina pectoris: Secondary | ICD-10-CM | POA: Diagnosis not present

## 2015-07-06 DIAGNOSIS — I10 Essential (primary) hypertension: Secondary | ICD-10-CM | POA: Diagnosis not present

## 2015-07-06 DIAGNOSIS — Z881 Allergy status to other antibiotic agents status: Secondary | ICD-10-CM | POA: Diagnosis not present

## 2015-07-06 DIAGNOSIS — E785 Hyperlipidemia, unspecified: Secondary | ICD-10-CM | POA: Diagnosis not present

## 2015-07-06 DIAGNOSIS — R0600 Dyspnea, unspecified: Secondary | ICD-10-CM | POA: Diagnosis not present

## 2015-07-06 DIAGNOSIS — Z8673 Personal history of transient ischemic attack (TIA), and cerebral infarction without residual deficits: Secondary | ICD-10-CM | POA: Insufficient documentation

## 2015-07-06 DIAGNOSIS — H2512 Age-related nuclear cataract, left eye: Secondary | ICD-10-CM | POA: Diagnosis not present

## 2015-07-06 DIAGNOSIS — Z85828 Personal history of other malignant neoplasm of skin: Secondary | ICD-10-CM | POA: Diagnosis not present

## 2015-07-06 DIAGNOSIS — Z90710 Acquired absence of both cervix and uterus: Secondary | ICD-10-CM | POA: Insufficient documentation

## 2015-07-06 DIAGNOSIS — Z955 Presence of coronary angioplasty implant and graft: Secondary | ICD-10-CM | POA: Diagnosis not present

## 2015-07-06 DIAGNOSIS — I252 Old myocardial infarction: Secondary | ICD-10-CM | POA: Insufficient documentation

## 2015-07-06 HISTORY — DX: Malignant (primary) neoplasm, unspecified: C80.1

## 2015-07-06 HISTORY — DX: Transient cerebral ischemic attack, unspecified: G45.9

## 2015-07-06 HISTORY — DX: Unspecified osteoarthritis, unspecified site: M19.90

## 2015-07-06 HISTORY — DX: Other specified soft tissue disorders: M79.89

## 2015-07-06 HISTORY — PX: CATARACT EXTRACTION W/PHACO: SHX586

## 2015-07-06 SURGERY — PHACOEMULSIFICATION, CATARACT, WITH IOL INSERTION
Anesthesia: Monitor Anesthesia Care | Site: Eye | Laterality: Left | Wound class: Clean

## 2015-07-06 MED ORDER — BUPIVACAINE HCL (PF) 0.75 % IJ SOLN
INTRAMUSCULAR | Status: AC
  Filled 2015-07-06: qty 10

## 2015-07-06 MED ORDER — ALFENTANIL 500 MCG/ML IJ INJ
INJECTION | INTRAMUSCULAR | Status: DC | PRN
  Administered 2015-07-06: 500 ug via INTRAVENOUS

## 2015-07-06 MED ORDER — CYCLOPENTOLATE HCL 2 % OP SOLN
1.0000 [drp] | OPHTHALMIC | Status: DC | PRN
  Administered 2015-07-06 (×4): 1 [drp] via OPHTHALMIC

## 2015-07-06 MED ORDER — LIDOCAINE HCL (PF) 4 % IJ SOLN
INTRAMUSCULAR | Status: DC | PRN
  Administered 2015-07-06: 4 mL via OPHTHALMIC

## 2015-07-06 MED ORDER — CYCLOPENTOLATE HCL 2 % OP SOLN
OPHTHALMIC | Status: AC
  Administered 2015-07-06: 1 [drp] via OPHTHALMIC
  Filled 2015-07-06: qty 2

## 2015-07-06 MED ORDER — CYCLOPENTOLATE HCL 2 % OP SOLN: 1.0000 [drp] | OPHTHALMIC | Status: AC | PRN

## 2015-07-06 MED ORDER — LIDOCAINE HCL (PF) 4 % IJ SOLN
INTRAMUSCULAR | Status: AC
  Filled 2015-07-06: qty 5

## 2015-07-06 MED ORDER — PHENYLEPHRINE HCL 10 % OP SOLN: 1.0000 [drp] | OPHTHALMIC | Status: AC | PRN

## 2015-07-06 MED ORDER — CEFUROXIME OPHTHALMIC INJECTION 1 MG/0.1 ML
INJECTION | OPHTHALMIC | Status: DC | PRN
  Administered 2015-07-06: .1 mL via INTRACAMERAL

## 2015-07-06 MED ORDER — POVIDONE-IODINE 5 % OP SOLN
OPHTHALMIC | Status: DC | PRN
  Administered 2015-07-06: 1 via OPHTHALMIC

## 2015-07-06 MED ORDER — SODIUM CHLORIDE 0.9 % IV SOLN
INTRAVENOUS | Status: DC
  Administered 2015-07-06: 10:00:00 via INTRAVENOUS

## 2015-07-06 MED ORDER — NA CHONDROIT SULF-NA HYALURON 40-17 MG/ML IO SOLN
INTRAOCULAR | Status: AC
  Filled 2015-07-06: qty 1

## 2015-07-06 MED ORDER — LIDOCAINE HCL (PF) 4 % IJ SOLN
INTRAOCULAR | Status: DC | PRN
  Administered 2015-07-06: .5 mL via OPHTHALMIC

## 2015-07-06 MED ORDER — HYALURONIDASE HUMAN 150 UNIT/ML IJ SOLN
INTRAMUSCULAR | Status: AC
  Filled 2015-07-06: qty 1

## 2015-07-06 MED ORDER — CARBACHOL 0.01 % IO SOLN
INTRAOCULAR | Status: DC | PRN
  Administered 2015-07-06: .5 mL via INTRAOCULAR

## 2015-07-06 MED ORDER — PHENYLEPHRINE HCL 10 % OP SOLN
1.0000 [drp] | OPHTHALMIC | Status: DC | PRN
  Administered 2015-07-06 (×4): 1 [drp] via OPHTHALMIC

## 2015-07-06 MED ORDER — POVIDONE-IODINE 5 % OP SOLN
OPHTHALMIC | Status: AC
  Filled 2015-07-06: qty 30

## 2015-07-06 MED ORDER — MOXIFLOXACIN HCL 0.5 % OP SOLN
OPHTHALMIC | Status: DC | PRN
  Administered 2015-07-06: 1 [drp] via OPHTHALMIC

## 2015-07-06 MED ORDER — NA CHONDROIT SULF-NA HYALURON 40-17 MG/ML IO SOLN
INTRAOCULAR | Status: DC | PRN
  Administered 2015-07-06: 1 mL via INTRAOCULAR

## 2015-07-06 MED ORDER — TETRACAINE HCL 0.5 % OP SOLN
OPHTHALMIC | Status: DC | PRN
  Administered 2015-07-06: 1 [drp] via OPHTHALMIC

## 2015-07-06 MED ORDER — EPINEPHRINE HCL 1 MG/ML IJ SOLN
INTRAMUSCULAR | Status: AC
  Filled 2015-07-06: qty 2

## 2015-07-06 MED ORDER — PHENYLEPHRINE HCL 10 % OP SOLN
OPHTHALMIC | Status: AC
  Administered 2015-07-06: 1 [drp] via OPHTHALMIC
  Filled 2015-07-06: qty 5

## 2015-07-06 MED ORDER — MOXIFLOXACIN HCL 0.5 % OP SOLN: 1.0000 [drp] | OPHTHALMIC | Status: DC | PRN

## 2015-07-06 MED ORDER — MOXIFLOXACIN HCL 0.5 % OP SOLN
OPHTHALMIC | Status: AC
  Administered 2015-07-06: 1 [drp] via OPHTHALMIC
  Filled 2015-07-06: qty 3

## 2015-07-06 MED ORDER — EPINEPHRINE HCL 1 MG/ML IJ SOLN
INTRAOCULAR | Status: DC | PRN
  Administered 2015-07-06: 1 mL via OPHTHALMIC

## 2015-07-06 MED ORDER — TETRACAINE HCL 0.5 % OP SOLN
OPHTHALMIC | Status: AC
  Filled 2015-07-06: qty 2

## 2015-07-06 MED ORDER — CEFUROXIME OPHTHALMIC INJECTION 1 MG/0.1 ML
INJECTION | OPHTHALMIC | Status: AC
  Filled 2015-07-06: qty 0.1

## 2015-07-06 MED ORDER — MOXIFLOXACIN HCL 0.5 % OP SOLN
1.0000 [drp] | OPHTHALMIC | Status: AC | PRN
  Administered 2015-07-06 (×3): 1 [drp] via OPHTHALMIC

## 2015-07-06 SURGICAL SUPPLY — 30 items
CANNULA ANT/CHMB 27G (MISCELLANEOUS) ×1 IMPLANT
CANNULA ANT/CHMB 27GA (MISCELLANEOUS) ×3 IMPLANT
CORD BIP STRL DISP 12FT (MISCELLANEOUS) ×3 IMPLANT
CUP MEDICINE 2OZ PLAST GRAD ST (MISCELLANEOUS) ×3 IMPLANT
DRAPE XRAY CASSETTE 23X24 (DRAPES) ×3 IMPLANT
ERASER HMR WETFIELD 18G (MISCELLANEOUS) ×3 IMPLANT
GLOVE BIO SURGEON STRL SZ8 (GLOVE) ×3 IMPLANT
GLOVE SURG LX 6.5 MICRO (GLOVE) ×2
GLOVE SURG LX 8.0 MICRO (GLOVE) ×2
GLOVE SURG LX STRL 6.5 MICRO (GLOVE) ×1 IMPLANT
GLOVE SURG LX STRL 8.0 MICRO (GLOVE) ×1 IMPLANT
GOWN STRL REUS W/ TWL LRG LVL3 (GOWN DISPOSABLE) ×1 IMPLANT
GOWN STRL REUS W/ TWL XL LVL3 (GOWN DISPOSABLE) ×1 IMPLANT
GOWN STRL REUS W/TWL LRG LVL3 (GOWN DISPOSABLE) ×3
GOWN STRL REUS W/TWL XL LVL3 (GOWN DISPOSABLE) ×3
LENS IOL ACRYSOF IQ 21.5 (Intraocular Lens) ×2 IMPLANT
PACK CATARACT (MISCELLANEOUS) ×3 IMPLANT
PACK CATARACT DINGLEDEIN LX (MISCELLANEOUS) ×3 IMPLANT
PACK EYE AFTER SURG (MISCELLANEOUS) ×3 IMPLANT
SHLD EYE VISITEC  UNIV (MISCELLANEOUS) ×3 IMPLANT
SOL BSS BAG (MISCELLANEOUS) ×3
SOL PREP PVP 2OZ (MISCELLANEOUS) ×3
SOLUTION BSS BAG (MISCELLANEOUS) ×1 IMPLANT
SOLUTION PREP PVP 2OZ (MISCELLANEOUS) ×1 IMPLANT
SUT SILK 5-0 (SUTURE) ×3 IMPLANT
SYR 3ML LL SCALE MARK (SYRINGE) ×3 IMPLANT
SYR 5ML LL (SYRINGE) ×3 IMPLANT
SYR TB 1ML 27GX1/2 LL (SYRINGE) ×3 IMPLANT
WATER STERILE IRR 1000ML POUR (IV SOLUTION) ×3 IMPLANT
WIPE NON LINTING 3.25X3.25 (MISCELLANEOUS) ×3 IMPLANT

## 2015-07-06 NOTE — Transfer of Care (Signed)
Immediate Anesthesia Transfer of Care Note  Patient: Kendra Stevenson  Procedure(s) Performed: Procedure(s) with comments: CATARACT EXTRACTION PHACO AND INTRAOCULAR LENS PLACEMENT (IOC) (Left) - [place] 01:41 AP% 25.4 CDE 45.18 fluid pack lot # [id number]  Patient Location: PACU and Short Stay  Anesthesia Type:MAC  Level of Consciousness: awake, alert  and oriented  Airway & Oxygen Therapy: Patient Spontanous Breathing  Post-op Assessment: Report given to RN and Post -op Vital signs reviewed and stable  Post vital signs: Reviewed and stable  Last Vitals:  Filed Vitals:   07/06/15 1208 07/06/15 1211  BP: 170/78 170/78  Pulse: 62 60  Temp: 36.2 C   Resp: 16 18    Last Pain:  Filed Vitals:   07/06/15 1212  PainSc: 0-No pain         Complications: No apparent anesthesia complications

## 2015-07-06 NOTE — Discharge Instructions (Signed)
See hand out.

## 2015-07-06 NOTE — Op Note (Signed)
Date of Surgery: 07/06/2015 Date of Dictation: 07/06/2015 12:07 PM Pre-operative Diagnosis:  Nuclear Sclerotic Cataract left Eye Post-operative Diagnosis: same Procedure performed: Extra-capsular Cataract Extraction (ECCE) with placement of a posterior chamber intraocular lens (IOL) left Eye IOL:  Implant Name Type Inv. Item Serial No. Manufacturer Lot No. LRB No. Used  LENS IOL ACRYSOF IQ 21.5 - [id number] Intraocular Lens LENS IOL ACRYSOF IQ 21.5 [id number] ALCON   Left 1   Anesthesia: 2% Lidocaine and 4% Marcaine in a 50/50 mixture with 10 unites/ml of Hylenex given as a peribulbar Anesthesiologist: Anesthesiologist: Martha Clan, MD CRNA: Jonna Clark, CRNA Complications: none Estimated Blood Loss: less than 1 ml  Description of procedure:  The patient was given anesthesia and sedation via intravenous access. The patient was then prepped and draped in the usual fashion. A 25-gauge needle was bent for initiating the capsulorhexis. A 5-0 silk suture was placed through the conjunctiva superior and inferiorly to serve as bridle sutures. Hemostasis was obtained at the superior limbus using an eraser cautery. A partial thickness groove was made at the anterior surgical limbus with a 64 Beaver blade and this was dissected anteriorly with an [employer]. The anterior chamber was entered at 10 o'clock with a 1.0 mm paracentesis knife and through the lamellar dissection with a 2.6 mm Alcon keratome. Epi-Shugarcaine 0.5 CC [9 cc BSS Plus (Alcon), 3 cc 4% preservative-free lidocaine (Hospira) and 4 cc 1:1000 preservative-free, bisulfite-free epinephrine] was injected into the anterior chamber via the paracentesis tract. Epi-Shugarcaine 0.5 CC [9 cc BSS Plus (Alcon), 3 cc 4% preservative-free lidocaine (Hospira) and 4 cc 1:1000 preservative-free, bisulfite-free epinephrine] was injected into the anterior chamber via the paracentesis tract. DiscoVisc was injected to replace the aqueous and a  continuous tear curvilinear capsulorhexis was performed using a bent 25-gauge needle.  Balance salt on a syringe was used to perform hydro-dissection and phacoemulsification was carried out using a divide and conquer technique. Procedure(s) with comments: CATARACT EXTRACTION PHACO AND INTRAOCULAR LENS PLACEMENT (IOC) (Left) - [place] 01:41 AP% 25.4 CDE 45.18 fluid pack lot # [id number]. Irrigation/aspiration was used to remove the residual cortex and the capsular bag was inflated with DiscoVisc. The intraocular lens was inserted into the capsular bag using a pre-loaded UltraSert Delivery System. Irrigation/aspiration was used to remove the residual DiscoVisc. The wound was inflated with balanced salt and checked for leaks. None were found. Miostat was injected via the paracentesis track and 0.1 ml of cefuroxime containing 1 mg of drug  was injected via the paracentesis track. The wound was checked for leaks again and none were found.   The bridal sutures were removed and two drops of Vigamox were placed on the eye. An eye shield was placed to protect the eye and the patient was discharged to the recovery area in good condition.   Beuford Garcilazo MD

## 2015-07-06 NOTE — Anesthesia Preprocedure Evaluation (Signed)
Anesthesia Evaluation  Patient identified by MRN, date of birth, ID band Patient awake    Reviewed: Allergy & Precautions, H&P , NPO status , Patient's Chart, lab work & pertinent test results, reviewed documented beta blocker date and time   History of Anesthesia Complications Negative for: history of anesthetic complications  Airway Mallampati: II  TM Distance: >3 FB Neck ROM: full    Dental  (+) Edentulous Upper, Upper Dentures, Lower Dentures, Partial Lower   Pulmonary neg pulmonary ROS,    breath sounds clear to auscultation       Cardiovascular Exercise Tolerance: Good hypertension, On Medications (-) angina+ CAD, + Past MI, + Cardiac Stents and + DOE  (-) CABG Normal cardiovascular exam(-) dysrhythmias (-) Valvular Problems/Murmurs Rhythm:regular Rate:Normal     Neuro/Psych neg Seizures TIAnegative psych ROS   GI/Hepatic negative GI ROS, Neg liver ROS,   Endo/Other  negative endocrine ROS  Renal/GU negative Renal ROS  negative genitourinary   Musculoskeletal   Abdominal   Peds  Hematology negative hematology ROS (+)   Anesthesia Other Findings Past Medical History:   CAD                                             10/20/2009    HYPERTENSION                                    10/20/2009    HYPERLIPIDEMIA                                  10/20/2009    TIA (transient ischemic attack)                              Arthritis                                                    Bilateral swelling of feet                                     Comment:and legs   Cancer (HCC)                                                   Comment:skin   Reproductive/Obstetrics negative OB ROS                             Anesthesia Physical Anesthesia Plan  ASA: III  Anesthesia Plan: MAC   Post-op Pain Management:    Induction:   Airway Management Planned:   Additional Equipment:   Intra-op Plan:    Post-operative Plan:   Informed Consent: I have reviewed the patients History and Physical, chart, labs and discussed the procedure including the risks, benefits and alternatives for the proposed anesthesia with the  patient or authorized representative who has indicated his/her understanding and acceptance.   Dental Advisory Given  Plan Discussed with: Anesthesiologist, CRNA and Surgeon  Anesthesia Plan Comments:         Anesthesia Quick Evaluation

## 2015-07-06 NOTE — Anesthesia Postprocedure Evaluation (Signed)
Anesthesia Post Note  Patient: BAYLEA NUNNELLEY  Procedure(s) Performed: Procedure(s) (LRB): CATARACT EXTRACTION PHACO AND INTRAOCULAR LENS PLACEMENT (IOC) (Left)  Patient location during evaluation: PACU Anesthesia Type: MAC Level of consciousness: awake and alert Pain management: pain level controlled Vital Signs Assessment: post-procedure vital signs reviewed and stable Respiratory status: spontaneous breathing, nonlabored ventilation, respiratory function stable and patient connected to nasal cannula oxygen Cardiovascular status: blood pressure returned to baseline and stable Postop Assessment: no signs of nausea or vomiting Anesthetic complications: no    Last Vitals:  Filed Vitals:   07/06/15 1211 07/06/15 1224  BP: 170/78 145/75  Pulse: 60 78  Temp:    Resp: 18 18    Last Pain:  Filed Vitals:   07/06/15 1225  PainSc: 0-No pain                 Martha Clan

## 2015-07-06 NOTE — Interval H&P Note (Signed)
History and Physical Interval Note:  07/06/2015 11:27 AM  Kendra Stevenson  has presented today for surgery, with the diagnosis of CATARACT  The various methods of treatment have been discussed with the patient and family. After consideration of risks, benefits and other options for treatment, the patient has consented to  Procedure(s): CATARACT EXTRACTION PHACO AND INTRAOCULAR LENS PLACEMENT ([facility]) (Left) as a surgical intervention .  The patient's history has been reviewed, patient examined, no change in status, stable for surgery.  I have reviewed the patient's chart and labs.  Questions were answered to the patient's satisfaction.     Kendra Stevenson

## 2015-07-09 DIAGNOSIS — N183 Chronic kidney disease, stage 3 (moderate): Secondary | ICD-10-CM | POA: Diagnosis not present

## 2015-07-09 DIAGNOSIS — E7800 Pure hypercholesterolemia, unspecified: Secondary | ICD-10-CM | POA: Diagnosis not present

## 2015-07-13 DIAGNOSIS — E663 Overweight: Secondary | ICD-10-CM | POA: Diagnosis not present

## 2015-07-13 DIAGNOSIS — E7800 Pure hypercholesterolemia, unspecified: Secondary | ICD-10-CM | POA: Diagnosis not present

## 2015-07-13 DIAGNOSIS — N183 Chronic kidney disease, stage 3 (moderate): Secondary | ICD-10-CM | POA: Diagnosis not present

## 2015-07-13 DIAGNOSIS — I10 Essential (primary) hypertension: Secondary | ICD-10-CM | POA: Diagnosis not present

## 2015-07-13 DIAGNOSIS — Z6829 Body mass index (BMI) 29.0-29.9, adult: Secondary | ICD-10-CM | POA: Diagnosis not present

## 2015-10-20 DIAGNOSIS — Z6829 Body mass index (BMI) 29.0-29.9, adult: Secondary | ICD-10-CM | POA: Diagnosis not present

## 2015-10-20 DIAGNOSIS — Z9181 History of falling: Secondary | ICD-10-CM | POA: Diagnosis not present

## 2015-10-20 DIAGNOSIS — M1712 Unilateral primary osteoarthritis, left knee: Secondary | ICD-10-CM | POA: Diagnosis not present

## 2015-10-20 DIAGNOSIS — M25562 Pain in left knee: Secondary | ICD-10-CM | POA: Diagnosis not present

## 2015-10-27 DIAGNOSIS — M170 Bilateral primary osteoarthritis of knee: Secondary | ICD-10-CM | POA: Diagnosis not present

## 2015-10-27 DIAGNOSIS — G8929 Other chronic pain: Secondary | ICD-10-CM | POA: Diagnosis not present

## 2015-10-27 DIAGNOSIS — M25461 Effusion, right knee: Secondary | ICD-10-CM | POA: Diagnosis not present

## 2015-10-27 DIAGNOSIS — M25561 Pain in right knee: Secondary | ICD-10-CM | POA: Diagnosis not present

## 2015-10-27 DIAGNOSIS — M76891 Other specified enthesopathies of right lower limb, excluding foot: Secondary | ICD-10-CM | POA: Diagnosis not present

## 2015-10-27 DIAGNOSIS — M25562 Pain in left knee: Secondary | ICD-10-CM | POA: Diagnosis not present

## 2016-01-11 DIAGNOSIS — N183 Chronic kidney disease, stage 3 (moderate): Secondary | ICD-10-CM | POA: Diagnosis not present

## 2016-01-11 DIAGNOSIS — M810 Age-related osteoporosis without current pathological fracture: Secondary | ICD-10-CM | POA: Diagnosis not present

## 2016-01-11 DIAGNOSIS — E7800 Pure hypercholesterolemia, unspecified: Secondary | ICD-10-CM | POA: Diagnosis not present

## 2016-01-12 DIAGNOSIS — D225 Melanocytic nevi of trunk: Secondary | ICD-10-CM | POA: Diagnosis not present

## 2016-01-12 DIAGNOSIS — D0439 Carcinoma in situ of skin of other parts of face: Secondary | ICD-10-CM | POA: Diagnosis not present

## 2016-01-12 DIAGNOSIS — L570 Actinic keratosis: Secondary | ICD-10-CM | POA: Diagnosis not present

## 2016-01-12 DIAGNOSIS — X32XXXD Exposure to sunlight, subsequent encounter: Secondary | ICD-10-CM | POA: Diagnosis not present

## 2016-01-21 DIAGNOSIS — Z23 Encounter for immunization: Secondary | ICD-10-CM | POA: Diagnosis not present

## 2016-01-21 DIAGNOSIS — E7800 Pure hypercholesterolemia, unspecified: Secondary | ICD-10-CM | POA: Diagnosis not present

## 2016-01-21 DIAGNOSIS — Z6829 Body mass index (BMI) 29.0-29.9, adult: Secondary | ICD-10-CM | POA: Diagnosis not present

## 2016-01-21 DIAGNOSIS — E663 Overweight: Secondary | ICD-10-CM | POA: Diagnosis not present

## 2016-01-21 DIAGNOSIS — N183 Chronic kidney disease, stage 3 (moderate): Secondary | ICD-10-CM | POA: Diagnosis not present

## 2016-01-21 DIAGNOSIS — I10 Essential (primary) hypertension: Secondary | ICD-10-CM | POA: Diagnosis not present

## 2016-02-02 DIAGNOSIS — G8929 Other chronic pain: Secondary | ICD-10-CM | POA: Diagnosis not present

## 2016-02-02 DIAGNOSIS — M170 Bilateral primary osteoarthritis of knee: Secondary | ICD-10-CM | POA: Diagnosis not present

## 2016-02-13 ENCOUNTER — Encounter (HOSPITAL_COMMUNITY)
Admission: EM | Disposition: A | Discharge status: Skilled Nursing Facility | Payer: Self-pay | Source: Home / Self Care | Attending: Internal Medicine

## 2016-02-13 ENCOUNTER — Inpatient Hospital Stay (HOSPITAL_COMMUNITY): Payer: Medicare Other | Admitting: Anesthesiology

## 2016-02-13 ENCOUNTER — Inpatient Hospital Stay (HOSPITAL_COMMUNITY)
Admission: EM | Admit: 2016-02-13 | Discharge: 2016-02-16 | DRG: 481 | Disposition: A | Discharge status: Skilled Nursing Facility | Payer: Medicare Other | Attending: Internal Medicine | Admitting: Internal Medicine

## 2016-02-13 ENCOUNTER — Inpatient Hospital Stay (HOSPITAL_COMMUNITY): Payer: Medicare Other

## 2016-02-13 ENCOUNTER — Encounter (HOSPITAL_COMMUNITY): Payer: Self-pay | Admitting: *Deleted

## 2016-02-13 ENCOUNTER — Emergency Department (HOSPITAL_COMMUNITY): Payer: Medicare Other

## 2016-02-13 DIAGNOSIS — I10 Essential (primary) hypertension: Secondary | ICD-10-CM | POA: Diagnosis not present

## 2016-02-13 DIAGNOSIS — S72142D Displaced intertrochanteric fracture of left femur, subsequent encounter for closed fracture with routine healing: Secondary | ICD-10-CM | POA: Diagnosis not present

## 2016-02-13 DIAGNOSIS — Z419 Encounter for procedure for purposes other than remedying health state, unspecified: Secondary | ICD-10-CM

## 2016-02-13 DIAGNOSIS — W010XXA Fall on same level from slipping, tripping and stumbling without subsequent striking against object, initial encounter: Secondary | ICD-10-CM | POA: Diagnosis present

## 2016-02-13 DIAGNOSIS — M6281 Muscle weakness (generalized): Secondary | ICD-10-CM | POA: Diagnosis not present

## 2016-02-13 DIAGNOSIS — T148XXA Other injury of unspecified body region, initial encounter: Secondary | ICD-10-CM | POA: Diagnosis not present

## 2016-02-13 DIAGNOSIS — I2510 Atherosclerotic heart disease of native coronary artery without angina pectoris: Secondary | ICD-10-CM | POA: Diagnosis not present

## 2016-02-13 DIAGNOSIS — Z8673 Personal history of transient ischemic attack (TIA), and cerebral infarction without residual deficits: Secondary | ICD-10-CM | POA: Diagnosis not present

## 2016-02-13 DIAGNOSIS — E785 Hyperlipidemia, unspecified: Secondary | ICD-10-CM | POA: Diagnosis present

## 2016-02-13 DIAGNOSIS — M25552 Pain in left hip: Secondary | ICD-10-CM | POA: Diagnosis not present

## 2016-02-13 DIAGNOSIS — S72002A Fracture of unspecified part of neck of left femur, initial encounter for closed fracture: Secondary | ICD-10-CM | POA: Insufficient documentation

## 2016-02-13 DIAGNOSIS — Y92009 Unspecified place in unspecified non-institutional (private) residence as the place of occurrence of the external cause: Secondary | ICD-10-CM

## 2016-02-13 DIAGNOSIS — S299XXA Unspecified injury of thorax, initial encounter: Secondary | ICD-10-CM | POA: Diagnosis not present

## 2016-02-13 DIAGNOSIS — R52 Pain, unspecified: Secondary | ICD-10-CM

## 2016-02-13 DIAGNOSIS — R262 Difficulty in walking, not elsewhere classified: Secondary | ICD-10-CM

## 2016-02-13 DIAGNOSIS — R531 Weakness: Secondary | ICD-10-CM | POA: Diagnosis not present

## 2016-02-13 DIAGNOSIS — M810 Age-related osteoporosis without current pathological fracture: Secondary | ICD-10-CM | POA: Diagnosis not present

## 2016-02-13 DIAGNOSIS — Z955 Presence of coronary angioplasty implant and graft: Secondary | ICD-10-CM | POA: Diagnosis not present

## 2016-02-13 DIAGNOSIS — M84459D Pathological fracture, hip, unspecified, subsequent encounter for fracture with routine healing: Secondary | ICD-10-CM | POA: Diagnosis not present

## 2016-02-13 DIAGNOSIS — N179 Acute kidney failure, unspecified: Secondary | ICD-10-CM | POA: Diagnosis present

## 2016-02-13 DIAGNOSIS — W19XXXD Unspecified fall, subsequent encounter: Secondary | ICD-10-CM | POA: Diagnosis not present

## 2016-02-13 DIAGNOSIS — Z90710 Acquired absence of both cervix and uterus: Secondary | ICD-10-CM | POA: Diagnosis not present

## 2016-02-13 DIAGNOSIS — R609 Edema, unspecified: Secondary | ICD-10-CM | POA: Diagnosis not present

## 2016-02-13 DIAGNOSIS — S72142A Displaced intertrochanteric fracture of left femur, initial encounter for closed fracture: Secondary | ICD-10-CM | POA: Diagnosis present

## 2016-02-13 DIAGNOSIS — Z7982 Long term (current) use of aspirin: Secondary | ICD-10-CM

## 2016-02-13 DIAGNOSIS — S72009A Fracture of unspecified part of neck of unspecified femur, initial encounter for closed fracture: Secondary | ICD-10-CM | POA: Diagnosis not present

## 2016-02-13 DIAGNOSIS — M8000XD Age-related osteoporosis with current pathological fracture, unspecified site, subsequent encounter for fracture with routine healing: Secondary | ICD-10-CM | POA: Diagnosis not present

## 2016-02-13 DIAGNOSIS — W010XXD Fall on same level from slipping, tripping and stumbling without subsequent striking against object, subsequent encounter: Secondary | ICD-10-CM | POA: Diagnosis not present

## 2016-02-13 HISTORY — PX: INTRAMEDULLARY (IM) NAIL INTERTROCHANTERIC: SHX5875

## 2016-02-13 LAB — BASIC METABOLIC PANEL
ANION GAP: 7 (ref 5–15)
BUN: 19 mg/dL (ref 6–20)
CHLORIDE: 110 mmol/L (ref 101–111)
CO2: 24 mmol/L (ref 22–32)
Calcium: 9.8 mg/dL (ref 8.9–10.3)
Creatinine, Ser: 1.05 mg/dL — ABNORMAL HIGH (ref 0.44–1.00)
GFR calc non Af Amer: 46 mL/min — ABNORMAL LOW (ref >60)
GFR, EST AFRICAN AMERICAN: 54 mL/min — AB (ref >60)
Glucose, Bld: 88 mg/dL (ref 65–99)
POTASSIUM: 4.3 mmol/L (ref 3.5–5.1)
SODIUM: 141 mmol/L (ref 135–145)

## 2016-02-13 LAB — CBC WITH DIFFERENTIAL/PLATELET
Basophils Absolute: 0 10*3/uL (ref 0.0–0.1)
Basophils Relative: 0 %
Eosinophils Absolute: 0.2 10*3/uL (ref 0.0–0.7)
Eosinophils Relative: 3 %
HEMATOCRIT: 45 % (ref 36.0–46.0)
HEMOGLOBIN: 14.3 g/dL (ref 12.0–15.0)
LYMPHS ABS: 1.7 10*3/uL (ref 0.7–4.0)
LYMPHS PCT: 27 %
MCH: 30.2 pg (ref 26.0–34.0)
MCHC: 31.8 g/dL (ref 30.0–36.0)
MCV: 95.1 fL (ref 78.0–100.0)
Monocytes Absolute: 0.4 10*3/uL (ref 0.1–1.0)
Monocytes Relative: 7 %
NEUTROS ABS: 3.9 10*3/uL (ref 1.7–7.7)
NEUTROS PCT: 63 %
Platelets: 170 10*3/uL (ref 150–400)
RBC: 4.73 MIL/uL (ref 3.87–5.11)
RDW: 13.3 % (ref 11.5–15.5)
WBC: 6.2 10*3/uL (ref 4.0–10.5)

## 2016-02-13 LAB — PROTIME-INR
INR: 1
Prothrombin Time: 13.2 seconds (ref 11.4–15.2)

## 2016-02-13 SURGERY — FIXATION, FRACTURE, INTERTROCHANTERIC, WITH INTRAMEDULLARY ROD
Anesthesia: Choice | Site: Hip | Laterality: Left

## 2016-02-13 MED ORDER — 0.9 % SODIUM CHLORIDE (POUR BTL) OPTIME
TOPICAL | Status: DC | PRN
  Administered 2016-02-13: 100 mL

## 2016-02-13 MED ORDER — LACTATED RINGERS IV SOLN
INTRAVENOUS | Status: DC | PRN
  Administered 2016-02-13: 13:00:00 via INTRAVENOUS

## 2016-02-13 MED ORDER — HYDROMORPHONE HCL 2 MG/ML IJ SOLN
0.5000 mg | INTRAMUSCULAR | Status: AC | PRN
  Administered 2016-02-13 (×2): 0.5 mg via INTRAVENOUS
  Filled 2016-02-13 (×2): qty 1

## 2016-02-13 MED ORDER — SODIUM CHLORIDE 0.9 % IV SOLN
INTRAVENOUS | Status: DC
  Administered 2016-02-13: 09:00:00 via INTRAVENOUS

## 2016-02-13 MED ORDER — OXYCODONE HCL 5 MG PO TABS: 5.0000 mg | ORAL_TABLET | Freq: Once | ORAL | Status: DC | PRN

## 2016-02-13 MED ORDER — POVIDONE-IODINE 10 % EX SWAB: 2.0000 "application " | Freq: Once | CUTANEOUS | Status: DC

## 2016-02-13 MED ORDER — METOCLOPRAMIDE HCL 5 MG PO TABS: 5.0000 mg | ORAL_TABLET | Freq: Three times a day (TID) | ORAL | Status: DC | PRN

## 2016-02-13 MED ORDER — DOCUSATE SODIUM 100 MG PO CAPS
100.0000 mg | ORAL_CAPSULE | Freq: Two times a day (BID) | ORAL | Status: DC
  Administered 2016-02-13 – 2016-02-16 (×6): 100 mg via ORAL
  Filled 2016-02-13 (×6): qty 1

## 2016-02-13 MED ORDER — ONDANSETRON HCL 4 MG PO TABS: 4.0000 mg | ORAL_TABLET | Freq: Four times a day (QID) | ORAL | Status: DC | PRN

## 2016-02-13 MED ORDER — DEXTROSE 5 % IV SOLN
INTRAVENOUS | Status: DC | PRN
  Administered 2016-02-13: 60 ug/min via INTRAVENOUS

## 2016-02-13 MED ORDER — PHENYLEPHRINE HCL 10 MG/ML IJ SOLN
INTRAMUSCULAR | Status: DC | PRN
  Administered 2016-02-13: 80 ug via INTRAVENOUS

## 2016-02-13 MED ORDER — ONDANSETRON HCL 4 MG/2ML IJ SOLN
4.0000 mg | Freq: Once | INTRAMUSCULAR | Status: AC
  Administered 2016-02-13: 4 mg via INTRAVENOUS
  Filled 2016-02-13: qty 2

## 2016-02-13 MED ORDER — HYDROCODONE-ACETAMINOPHEN 5-325 MG PO TABS
1.0000 | ORAL_TABLET | Freq: Four times a day (QID) | ORAL | Status: DC | PRN
  Administered 2016-02-13: 2 via ORAL
  Administered 2016-02-14: 1 via ORAL
  Administered 2016-02-14 (×2): 2 via ORAL
  Administered 2016-02-15 – 2016-02-16 (×2): 1 via ORAL
  Filled 2016-02-13: qty 1
  Filled 2016-02-13: qty 2
  Filled 2016-02-13 (×2): qty 1
  Filled 2016-02-13 (×2): qty 2

## 2016-02-13 MED ORDER — PHENOL 1.4 % MT LIQD: 1.0000 | OROMUCOSAL | Status: DC | PRN

## 2016-02-13 MED ORDER — ENOXAPARIN SODIUM 30 MG/0.3ML ~~LOC~~ SOLN
30.0000 mg | SUBCUTANEOUS | Status: DC
  Administered 2016-02-14 – 2016-02-16 (×3): 30 mg via SUBCUTANEOUS
  Filled 2016-02-13 (×3): qty 0.3

## 2016-02-13 MED ORDER — MORPHINE SULFATE (PF) 2 MG/ML IV SOLN
2.0000 mg | INTRAVENOUS | Status: DC | PRN
  Administered 2016-02-13: 2 mg via INTRAVENOUS
  Filled 2016-02-13: qty 1

## 2016-02-13 MED ORDER — LIDOCAINE 2% (20 MG/ML) 5 ML SYRINGE
INTRAMUSCULAR | Status: DC | PRN
Start: 2016-02-13 — End: 2016-02-13
  Administered 2016-02-13: 40 mg via INTRAVENOUS

## 2016-02-13 MED ORDER — HYDROCODONE-ACETAMINOPHEN 5-325 MG PO TABS: 1.0000 | ORAL_TABLET | Freq: Four times a day (QID) | ORAL | 0 refills | Status: DC | PRN

## 2016-02-13 MED ORDER — FENTANYL CITRATE (PF) 100 MCG/2ML IJ SOLN: 25.0000 ug | INTRAMUSCULAR | Status: DC | PRN

## 2016-02-13 MED ORDER — PROPOFOL 10 MG/ML IV BOLUS
INTRAVENOUS | Status: DC | PRN
  Administered 2016-02-13 (×2): 10 mg via INTRAVENOUS
  Administered 2016-02-13: 20 mg via INTRAVENOUS
  Administered 2016-02-13: 10 mg via INTRAVENOUS

## 2016-02-13 MED ORDER — CEFAZOLIN SODIUM-DEXTROSE 2-4 GM/100ML-% IV SOLN
INTRAVENOUS | Status: AC
  Filled 2016-02-13: qty 100

## 2016-02-13 MED ORDER — SODIUM CHLORIDE 0.45 % IV SOLN: INTRAVENOUS | Status: DC

## 2016-02-13 MED ORDER — MENTHOL 3 MG MT LOZG: 1.0000 | LOZENGE | OROMUCOSAL | Status: DC | PRN

## 2016-02-13 MED ORDER — ONDANSETRON HCL 4 MG/2ML IJ SOLN
INTRAMUSCULAR | Status: DC | PRN
  Administered 2016-02-13: 4 mg via INTRAVENOUS

## 2016-02-13 MED ORDER — BUPIVACAINE IN DEXTROSE 0.75-8.25 % IT SOLN
INTRATHECAL | Status: DC | PRN
  Administered 2016-02-13: 1.8 mL via INTRATHECAL

## 2016-02-13 MED ORDER — ASPIRIN 325 MG PO TABS: 325.0000 mg | ORAL_TABLET | Freq: Every day | ORAL | 0 refills | Status: AC

## 2016-02-13 MED ORDER — CHLORHEXIDINE GLUCONATE 4 % EX LIQD
60.0000 mL | Freq: Once | CUTANEOUS | Status: DC
  Filled 2016-02-13: qty 60

## 2016-02-13 MED ORDER — POLYETHYLENE GLYCOL 3350 17 G PO PACK
17.0000 g | PACK | Freq: Every day | ORAL | Status: DC | PRN
  Administered 2016-02-16: 17 g via ORAL
  Filled 2016-02-13: qty 1

## 2016-02-13 MED ORDER — METOCLOPRAMIDE HCL 5 MG/ML IJ SOLN: 5.0000 mg | Freq: Three times a day (TID) | INTRAMUSCULAR | Status: DC | PRN

## 2016-02-13 MED ORDER — ONDANSETRON HCL 4 MG/2ML IJ SOLN: 4.0000 mg | Freq: Four times a day (QID) | INTRAMUSCULAR | Status: DC | PRN

## 2016-02-13 MED ORDER — OXYCODONE HCL 5 MG/5ML PO SOLN: 5.0000 mg | Freq: Once | ORAL | Status: DC | PRN

## 2016-02-13 MED ORDER — CEFAZOLIN SODIUM-DEXTROSE 2-4 GM/100ML-% IV SOLN
2.0000 g | INTRAVENOUS | Status: AC
  Administered 2016-02-13: 2 g via INTRAVENOUS

## 2016-02-13 MED ORDER — HYDROMORPHONE HCL 2 MG/ML IJ SOLN: 0.5000 mg | INTRAMUSCULAR | Status: DC | PRN

## 2016-02-13 SURGICAL SUPPLY — 41 items
BIT DRILL CANN LG 4.3MM (BIT) IMPLANT
BNDG COHESIVE 4X5 TAN STRL (GAUZE/BANDAGES/DRESSINGS) ×3 IMPLANT
CANISTER SUCTION 2500CC (MISCELLANEOUS) ×3 IMPLANT
COVER MAYO STAND STRL (DRAPES) ×3 IMPLANT
COVER PERINEAL POST (MISCELLANEOUS) ×3 IMPLANT
COVER SURGICAL LIGHT HANDLE (MISCELLANEOUS) ×3 IMPLANT
DRAPE C-ARM 42X72 X-RAY (DRAPES) ×3 IMPLANT
DRAPE STERI IOBAN 125X83 (DRAPES) ×3 IMPLANT
DRILL BIT CANN LG 4.3MM (BIT) ×3
DRSG PAD ABDOMINAL 8X10 ST (GAUZE/BANDAGES/DRESSINGS) ×5 IMPLANT
DURAPREP 26ML APPLICATOR (WOUND CARE) ×3 IMPLANT
ELECT REM PT RETURN 9FT ADLT (ELECTROSURGICAL) ×3
ELECTRODE REM PT RTRN 9FT ADLT (ELECTROSURGICAL) ×1 IMPLANT
EVACUATOR 1/8 PVC DRAIN (DRAIN) IMPLANT
GAUZE SPONGE 4X4 12PLY STRL (GAUZE/BANDAGES/DRESSINGS) ×1 IMPLANT
GAUZE XEROFORM 1X8 LF (GAUZE/BANDAGES/DRESSINGS) ×1 IMPLANT
GLOVE BIOGEL PI IND STRL 8 (GLOVE) ×2 IMPLANT
GLOVE BIOGEL PI INDICATOR 8 (GLOVE) ×4
GLOVE ORTHO TXT STRL SZ7.5 (GLOVE) ×6 IMPLANT
GOWN STRL REUS W/ TWL LRG LVL3 (GOWN DISPOSABLE) ×1 IMPLANT
GOWN STRL REUS W/TWL 2XL LVL3 (GOWN DISPOSABLE) ×3 IMPLANT
GOWN STRL REUS W/TWL LRG LVL3 (GOWN DISPOSABLE) ×6 IMPLANT
GUIDEPIN 3.2X17.5 THRD DISP (PIN) ×2 IMPLANT
KIT BASIN OR (CUSTOM PROCEDURE TRAY) ×3 IMPLANT
KIT ROOM TURNOVER OR (KITS) ×3 IMPLANT
LINER BOOT UNIVERSAL DISP (MISCELLANEOUS) ×1 IMPLANT
MANIFOLD NEPTUNE II (INSTRUMENTS) ×1 IMPLANT
NAIL HIP FRACT 130D 11X180 (Screw) ×2 IMPLANT
NS IRRIG 1000ML POUR BTL (IV SOLUTION) ×3 IMPLANT
PACK GENERAL/GYN (CUSTOM PROCEDURE TRAY) ×3 IMPLANT
PAD ARMBOARD 7.5X6 YLW CONV (MISCELLANEOUS) ×8 IMPLANT
SCREW BONE CORTICAL 5.0X3 (Screw) ×2 IMPLANT
SCREW LAG 10.5MMX105MM HFN (Screw) ×2 IMPLANT
SPONGE GAUZE 4X4 12PLY STER LF (GAUZE/BANDAGES/DRESSINGS) ×2 IMPLANT
STAPLER VISISTAT 35W (STAPLE) ×3 IMPLANT
SUT VIC AB 0 CT1 27 (SUTURE) ×3
SUT VIC AB 0 CT1 27XBRD ANBCTR (SUTURE) ×1 IMPLANT
SUT VIC AB 2-0 CT1 27 (SUTURE) ×6
SUT VIC AB 2-0 CT1 TAPERPNT 27 (SUTURE) ×2 IMPLANT
TRAY FOLEY CATH SILVER 14FR (SET/KITS/TRAYS/PACK) ×2 IMPLANT
WATER STERILE IRR 1000ML POUR (IV SOLUTION) ×3 IMPLANT

## 2016-02-13 NOTE — ED Provider Notes (Addendum)
[facility] DEPT Provider Note   CSN: [id number] Arrival date & time: 02/13/16  0827     History   Chief Complaint Chief Complaint  Patient presents with  . Fall    HPI Kendra Stevenson is a 82 y.o. female.  HPI Patient presents to the emergency room after a fall at home this morning. The patient was attempting to stand up from a sitting position when she lost her balance and fell landing on her left hip. Patient denies any trouble with lightheadedness. She denies any chest pain or shortness of breath. She loses her balance sometimes and thinks this is what happened to her.  The patient is unfortunately having severe pain down her left hip. She is unable to stand. She is unable to move her leg without severe pain. She called EMS and was brought into the emergency room.  No history of prior hip injuries. She does not take any anticoagulants. Past Medical History:  Diagnosis Date  . Arthritis   . Bilateral swelling of feet    and legs  . CAD 10/20/2009  . Cancer ([place])    skin  . HYPERLIPIDEMIA 10/20/2009  . HYPERTENSION 10/20/2009  . TIA (transient ischemic attack)     Patient Active Problem List   Diagnosis Date Noted  . Edema extremities 06/04/2010  . HYPERLIPIDEMIA 10/20/2009  . HYPERTENSION 10/20/2009  . CAD 10/20/2009    Past Surgical History:  Procedure Laterality Date  . ABDOMINAL HYSTERECTOMY     partial  . APPENDECTOMY    . CATARACT EXTRACTION W/PHACO Left 07/06/2015   Procedure: CATARACT EXTRACTION PHACO AND INTRAOCULAR LENS PLACEMENT (IOC);  Surgeon: Estill Cotta, MD;  Location: [facility] ORS;  Service: Ophthalmology;  Laterality: Left;  [place] 01:41 AP% 25.4 CDE 45.18 fluid pack lot # [id number]  . CORONARY ANGIOPLASTY WITH STENT PLACEMENT    . EYE SURGERY     cataract  . KNEE ARTHROSCOPY Right 2001  . LEFT HEART CATH  09/2009  . TONSILLECTOMY      OB History    No data available       Home Medications    Prior to Admission medications     Medication Sig Start Date End Date Taking? Authorizing Provider  amLODipine (NORVASC) 5 MG tablet Take 1 tablet (5 mg total) by mouth daily. 03/26/14  Yes Dorothy Spark, MD  aspirin 81 MG tablet Take 81 mg by mouth daily.     Yes Historical Provider, MD  calcium citrate-vitamin D (CITRACAL+D) 315-200 MG-UNIT per tablet Take 1 tablet by mouth daily.     Yes Historical Provider, MD  co-enzyme Q-10 30 MG capsule Take 30 mg by mouth at bedtime.   Yes Historical Provider, MD  lisinopril (PRINIVIL,ZESTRIL) 20 MG tablet Take 20 mg by mouth every morning.   Yes Historical Provider, MD  Multiple Vitamin (MULTIVITAMIN) tablet Take 1 tablet by mouth daily.     Yes Historical Provider, MD  nitroGLYCERIN (NITROSTAT) 0.4 MG SL tablet Place 1 tablet (0.4 mg total) under the tongue every 5 (five) minutes as needed. 07/23/12  Yes Renella Cunas, MD  pravastatin (PRAVACHOL) 40 MG tablet Take 40 mg by mouth daily.     Yes Historical Provider, MD    Family History History reviewed. No pertinent family history.  Social History Social History  Substance Use Topics  . Smoking status: Never Smoker  . Smokeless tobacco: Not on file  . Alcohol use No     Allergies   Bactrim and Sulfamethoxazole-trimethoprim  Review of Systems Review of Systems  Constitutional: Negative for fever.  HENT: Negative for postnasal drip.   Respiratory: Negative for shortness of breath.   Cardiovascular: Negative for chest pain.  Gastrointestinal: Negative for abdominal pain.  Genitourinary: Negative for dysuria.  Musculoskeletal: Negative for back pain.  Neurological: Negative for seizures, speech difficulty, light-headedness and headaches.  All other systems reviewed and are negative.    Physical Exam Updated Vital Signs BP 131/70 (BP Location: Left Arm)   Pulse (!) 55   Temp 97.3 F (36.3 C) (Oral)   Resp 16   SpO2 92%   Physical Exam  Constitutional: No distress.  Elderly, frail  HENT:  Head: Normocephalic  and atraumatic.  Right Ear: External ear normal.  Left Ear: External ear normal.  Mouth/Throat: No oropharyngeal exudate.  Eyes: Conjunctivae are normal. Right eye exhibits no discharge. Left eye exhibits no discharge. No scleral icterus.  Neck: Neck supple. No tracheal deviation present.  Cardiovascular: Normal rate, regular rhythm and intact distal pulses.   Pulmonary/Chest: Effort normal and breath sounds normal. No stridor. No respiratory distress. She has no wheezes. She has no rales.  Abdominal: Soft. Bowel sounds are normal. She exhibits no distension. There is no tenderness. There is no rebound and no guarding.  Musculoskeletal: She exhibits tenderness. She exhibits no edema.  Left lower extremity is shortened, patient is unable to lift her left leg off the bed, tenderness palpation left hip  Neurological: She is alert. She has normal strength. No cranial nerve deficit (no facial droop, extraocular movements intact, no slurred speech) or sensory deficit. She exhibits normal muscle tone. She displays no seizure activity. Coordination normal.  Skin: Skin is warm and dry. No rash noted. She is not diaphoretic.  Psychiatric: She has a normal mood and affect.  Nursing note and vitals reviewed.    ED Treatments / Results  Labs (all labs ordered are listed, but only abnormal results are displayed) Labs Reviewed  BASIC METABOLIC PANEL - Abnormal; Notable for the following:       Result Value   Creatinine, Ser 1.05 (*)    GFR calc non Af Amer 46 (*)    GFR calc Af Amer 54 (*)    All other components within normal limits  CBC WITH DIFFERENTIAL/PLATELET  PROTIME-INR  TYPE AND SCREEN     Radiology Dg Chest 1 View  Result Date: 02/13/2016 CLINICAL DATA:  Fall EXAM: CHEST 1 VIEW COMPARISON:  10/09/2009 FINDINGS: Lungs are clear.  No pleural effusion or pneumothorax. The heart is mildly enlarged. Thoracic aortic atherosclerosis. Degenerative changes of the bilateral shoulders, right  greater than left. IMPRESSION: No evidence of acute cardiopulmonary disease. Thoracic aortic atherosclerosis. Electronically Signed   By: Julian Hy M.D.   On: 02/13/2016 09:56   Dg Hip Unilat With Pelvis 2-3 Views Left  Result Date: 02/13/2016 CLINICAL DATA:  Fall EXAM: DG HIP (WITH OR WITHOUT PELVIS) 2-3V LEFT COMPARISON:  None. FINDINGS: Intertrochanteric left hip fracture. Varus angulation with foreshortening. Bilobed hip joint spaces are symmetric. Visualized bony pelvis appears intact. Degenerative changes of the pubic symphysis. Degenerative changes of the lower lumbar spine. IMPRESSION: Intertrochanteric left hip fracture, as above. Electronically Signed   By: Julian Hy M.D.   On: 02/13/2016 09:55    Procedures Procedures (including critical care time)  Medications Ordered in ED Medications  0.9 %  sodium chloride infusion ( Intravenous New Bag/Given 02/13/16 0900)  HYDROmorphone (DILAUDID) injection 0.5 mg (0.5 mg Intravenous Given 02/13/16 0900)  ondansetron (ZOFRAN) injection 4 mg (4 mg Intravenous Given 02/13/16 0900)     Initial Impression / Assessment and Plan / ED Course  I have reviewed the triage vital signs and the nursing notes.  Pertinent labs & imaging results that were available during my care of the patient were reviewed by me and considered in my medical decision making (see chart for details).  Clinical Course as of Feb 12 1022  Sat Feb 13, 2016  1022 Dr Lorin Mercy.  Keep patient NPO.  Anticipate surgery later today  [JK]    Clinical Course User Index [JK] Dorie Rank, MD    Patient's x-rays confirm the clinical suspicion of a left hip fracture.  Patient is otherwise stable. No other acute medical issues. She denies any syncopal event or fall does sound mechanical in nature. I spoke with Dr. Lorin Mercy. Anticipate surgery later today. I will consult with the medical service for admission. Findings and plan were discussed with the patient and family. Final  Clinical Impressions(s) / ED Diagnoses   Final diagnoses:  Closed fracture of left hip, initial encounter [facility])    New Prescriptions New Prescriptions   No medications on file     Dorie Rank, MD 02/13/16 1024   EKG Interpretation  Date/Time:  Saturday February 13 2016 12:01:03 EST Ventricular Rate:  70 PR Interval:    QRS Duration: 94 QT Interval:  407 QTC Calculation: 440 R Axis:   56 Text Interpretation:  Sinus rhythm Borderline low voltage, extremity leads No significant change since last tracing Confirmed by Andrik Sandt  MD-J, Kendahl Bumgardner ([id number]) on 02/13/2016 12:17:20 PM         Dorie Rank, MD 02/13/16 1217

## 2016-02-13 NOTE — Anesthesia Preprocedure Evaluation (Signed)
Anesthesia Evaluation  Patient identified by MRN, date of birth, ID band Patient awake    Reviewed: Allergy & Precautions, NPO status , Patient's Chart, lab work & pertinent test results  History of Anesthesia Complications Negative for: history of anesthetic complications  Airway Mallampati: II  TM Distance: >3 FB Neck ROM: Full    Dental  (+) Partial Lower, Upper Dentures   Pulmonary neg pulmonary ROS,    breath sounds clear to auscultation       Cardiovascular hypertension, Pt. on medications (-) angina+ CAD and + Cardiac Stents  (-) Past MI and (-) CHF  Rhythm:Regular     Neuro/Psych neg Seizures TIAnegative psych ROS   GI/Hepatic negative GI ROS, Neg liver ROS,   Endo/Other  negative endocrine ROS  Renal/GU negative Renal ROS     Musculoskeletal  (+) Arthritis , Left hip fracture   Abdominal   Peds  Hematology negative hematology ROS (+)   Anesthesia Other Findings   Reproductive/Obstetrics                             Anesthesia Physical Anesthesia Plan  ASA: II  Anesthesia Plan: Spinal   Post-op Pain Management:    Induction:   Airway Management Planned: Natural Airway, Nasal Cannula and Simple Face Mask  Additional Equipment: None  Intra-op Plan:   Post-operative Plan:   Informed Consent: I have reviewed the patients History and Physical, chart, labs and discussed the procedure including the risks, benefits and alternatives for the proposed anesthesia with the patient or authorized representative who has indicated his/her understanding and acceptance.   Dental advisory given  Plan Discussed with: CRNA and Surgeon  Anesthesia Plan Comments:         Anesthesia Quick Evaluation

## 2016-02-13 NOTE — Anesthesia Procedure Notes (Signed)
Date/Time: 02/13/2016 2:07 PM Performed by: Marinda Elk A Oxygen Delivery Method: Simple face mask

## 2016-02-13 NOTE — Op Note (Signed)
Preop diagnosis: Left intertrochanteric hip fracture  Postop diagnosis: Same  Procedure: Left trochanteric nail affixes 11 mm times 130 degree angle 11mm length lag screw. Open reduction internal fixation of intertrochanteric hip fracture.  Surgeon: Rodell Perna M.D.  Anesthesia spinal  complications none.  Procedure after induction of spinal anesthesia with the result patient was placed on the fracture table. The Geni Bers was used to secure the foot traction internal rotation C-arm well legholder the opposite leg central post. There was a near anatomic reduction. Prepping preoperative Ancef timeout procedure area squared with towels large shower curtain Betadine Steri-Drape was applied. Lateral incision was made proximal to greater trochanter. There is a large amount of clot in the subcutaneous tissue had pulled loose from the bone in the trochanteric region with large amount of flow hematoma then is normal. Tip was palpated the Steinmann pin was drilled to start the hole checked under C-arm overreamed and then the short nail was inserted checking under C-arm to make sure went down the canal. It was advanced so that the lag screw would be low in the neck on AP and Center in the head on lateral. Drilled measured overreamed screw was placed up. At the last. As the screw was being tightened down it pushed the head slightly in the more valgus. It was back down slightly and then the LAD to compress with compression screw device and then it was locked in place proximally. This made the lateral aspect of the lag screw sticking out about a centimeter from the lateral cortex of the femur. It is not palpable the subcutaneous tissue at the time of closure. A 36 mm nail distal interlock screw was placed drilled checked under C-arm AP and lateral final spot pictures were taken showing good position and alignment. After irrigation with saline solution deep fascia was reapproximated with the #1 Vicryl suture. Bovie  electrocautery been used throughout the case try to help with hemostasis. 2-0 Vicryl subtendinous tissue skin staple closure postop dressing and transferred to the recovery room.

## 2016-02-13 NOTE — H&P (View-Only) (Signed)
 Reason for Consult: Left closed intertrochanteric hip fracture with shortening and varus angulation. Referring Physician: E. Hoffman M.D.  Kendra Stevenson is an 82 y.o. female.  HPI: 82-year-old female fell at home with acute left hip pain. She was attempting to stand up fell and lost her balance landing on her left hip. Patient does have some coronary artery disease, remote history of TIA, hypertension and hyperlipidemia. Medical admitting physicians present at the bedside and outlined treatment plan for surgical stabilization of the hip was discussed. Patient has 3 children at the bedside also. They provided additional history.  Past Medical History:  Diagnosis Date  . Arthritis   . Bilateral swelling of feet    and legs  . CAD 10/20/2009  . Cancer ([facility])    skin  . HYPERLIPIDEMIA 10/20/2009  . HYPERTENSION 10/20/2009  . TIA (transient ischemic attack)     Past Surgical History:  Procedure Laterality Date  . ABDOMINAL HYSTERECTOMY     partial  . APPENDECTOMY    . CATARACT EXTRACTION W/PHACO Left 07/06/2015   Procedure: CATARACT EXTRACTION PHACO AND INTRAOCULAR LENS PLACEMENT ([facility]);  Surgeon: Steven Dingeldein, MD;  Location: [facility] ORS;  Service: Ophthalmology;  Laterality: Left;  [place] 01:41 AP% 25.4 CDE 45.18 fluid pack lot # [id number]  . CORONARY ANGIOPLASTY WITH STENT PLACEMENT    . EYE SURGERY     cataract  . KNEE ARTHROSCOPY Right 2001  . LEFT HEART CATH  09/2009  . TONSILLECTOMY      History reviewed. No pertinent family history.  Social History:  reports that she has never smoked. She does not have any smokeless tobacco history on file. She reports that she does not drink alcohol or use drugs.  Allergies:  Allergies  Allergen Reactions  . Bactrim   . Sulfamethoxazole-Trimethoprim     REACTION: rash    Medications: I have reviewed the patient's current medications.  Results for orders placed or performed during the hospital encounter of 02/13/16 (from the past 48  hour(s))  Basic metabolic panel     Status: Abnormal   Collection Time: 02/13/16  8:43 AM  Result Value Ref Range   Sodium 141 135 - 145 mmol/L   Potassium 4.3 3.5 - 5.1 mmol/L   Chloride 110 101 - 111 mmol/L   CO2 24 22 - 32 mmol/L   Glucose, Bld 88 65 - 99 mg/dL   BUN 19 6 - 20 mg/dL   Creatinine, Ser 1.05 (H) 0.44 - 1.00 mg/dL   Calcium 9.8 8.9 - 10.3 mg/dL   GFR calc non Af Amer 46 (L) >60 mL/min   GFR calc Af Amer 54 (L) >60 mL/min    Comment: (NOTE) The eGFR has been calculated using the CKD EPI equation. This calculation has not been validated in all clinical situations. eGFR's persistently <60 mL/min signify possible Chronic Kidney Disease.    Anion gap 7 5 - 15  CBC WITH DIFFERENTIAL     Status: None   Collection Time: 02/13/16  8:43 AM  Result Value Ref Range   WBC 6.2 4.0 - 10.5 K/uL   RBC 4.73 3.87 - 5.11 MIL/uL   Hemoglobin 14.3 12.0 - 15.0 g/dL   HCT 45.0 36.0 - 46.0 %   MCV 95.1 78.0 - 100.0 fL   MCH 30.2 26.0 - 34.0 pg   MCHC 31.8 30.0 - 36.0 g/dL   RDW 13.3 11.5 - 15.5 %   Platelets 170 150 - 400 K/uL   Neutrophils Relative % 63 %     Neutro Abs 3.9 1.7 - 7.7 K/uL   Lymphocytes Relative 27 %   Lymphs Abs 1.7 0.7 - 4.0 K/uL   Monocytes Relative 7 %   Monocytes Absolute 0.4 0.1 - 1.0 K/uL   Eosinophils Relative 3 %   Eosinophils Absolute 0.2 0.0 - 0.7 K/uL   Basophils Relative 0 %   Basophils Absolute 0.0 0.0 - 0.1 K/uL  Protime-INR     Status: None   Collection Time: 02/13/16  8:43 AM  Result Value Ref Range   Prothrombin Time 13.2 11.4 - 15.2 seconds   INR 1.00   Type and screen [facility]     Status: None (Preliminary result)   Collection Time: 02/13/16  8:50 AM  Result Value Ref Range   ABO/RH(D) B POS    Antibody Screen PENDING    Sample Expiration 02/16/2016     Dg Chest 1 View  Result Date: 02/13/2016 CLINICAL DATA:  Fall EXAM: CHEST 1 VIEW COMPARISON:  10/09/2009 FINDINGS: Lungs are clear.  No pleural effusion or  pneumothorax. The heart is mildly enlarged. Thoracic aortic atherosclerosis. Degenerative changes of the bilateral shoulders, right greater than left. IMPRESSION: No evidence of acute cardiopulmonary disease. Thoracic aortic atherosclerosis. Electronically Signed   By: Sriyesh  Krishnan M.D.   On: 02/13/2016 09:56   Dg Hip Unilat With Pelvis 2-3 Views Left  Result Date: 02/13/2016 CLINICAL DATA:  Fall EXAM: DG HIP (WITH OR WITHOUT PELVIS) 2-3V LEFT COMPARISON:  None. FINDINGS: Intertrochanteric left hip fracture. Varus angulation with foreshortening. Bilobed hip joint spaces are symmetric. Visualized bony pelvis appears intact. Degenerative changes of the pubic symphysis. Degenerative changes of the lower lumbar spine. IMPRESSION: Intertrochanteric left hip fracture, as above. Electronically Signed   By: Sriyesh  Krishnan M.D.   On: 02/13/2016 09:55    Review of Systems  Constitutional: Negative for chills and fever.  Respiratory: Negative.   Cardiovascular: Positive for leg swelling.       Positive for hypertension and hyperlipidemia.  Gastrointestinal: Negative.   Genitourinary: Negative.   Musculoskeletal:       Wasn't for follow with acute left hip pain.  Psychiatric/Behavioral: Negative for substance abuse.   Blood pressure 129/69, pulse 74, temperature 97.3 F (36.3 C), temperature source Oral, resp. rate 16, SpO2 90 %. Physical Exam  Constitutional: She is oriented to person, place, and time. She appears well-developed and well-nourished.  HENT:  Head: Normocephalic and atraumatic.  Eyes: Conjunctivae are normal. Pupils are equal, round, and reactive to light.  Neck: Normal range of motion. Neck supple.  Cardiovascular: Normal rate and regular rhythm.   Respiratory: Effort normal. She has no wheezes.  GI: Soft. She exhibits no distension. There is no tenderness.  Musculoskeletal:  Left lower extremity short and externally rotated. Sciatic function is intact distal pulses are  intact right and left. Mild edema both lower extremities.  Neurological: She is alert and oriented to person, place, and time.  Psychiatric: She has a normal mood and affect. Thought content normal.    Assessment/Plan: Left closed intertrochanteric hip fracture with shortening and varus angulation. Patient 0 family also provided the history. She's been nothing by mouth since yesterday. Plan hip stabilization with trochanteric nail for intertrochanteric hip fracture. Risks of surgery discussed including bleeding, infection reoperation, risks of anesthesia heart attack stroke and death. Operative versus nonoperative treatment options were discussed.. Questions were elicited and answered ,patient understands and agrees to proceed  Kendra Stevenson 02/13/2016, 12:36 PM      

## 2016-02-13 NOTE — Consult Note (Signed)
Reason for Consult: Left closed intertrochanteric hip fracture with shortening and varus angulation. Referring Physician: E. Heber Victoria M.D.  Kendra Stevenson is an 82 y.o. female.  HPI: 82 year old female fell at home with acute left hip pain. She was attempting to stand up fell and lost her balance landing on her left hip. Patient does have some coronary artery disease, remote history of TIA, hypertension and hyperlipidemia. Medical admitting physicians present at the bedside and outlined treatment plan for surgical stabilization of the hip was discussed. Patient has 3 children at the bedside also. They provided additional history.  Past Medical History:  Diagnosis Date  . Arthritis   . Bilateral swelling of feet    and legs  . CAD 10/20/2009  . Cancer ([place])    skin  . HYPERLIPIDEMIA 10/20/2009  . HYPERTENSION 10/20/2009  . TIA (transient ischemic attack)     Past Surgical History:  Procedure Laterality Date  . ABDOMINAL HYSTERECTOMY     partial  . APPENDECTOMY    . CATARACT EXTRACTION W/PHACO Left 07/06/2015   Procedure: CATARACT EXTRACTION PHACO AND INTRAOCULAR LENS PLACEMENT (IOC);  Surgeon: Estill Cotta, MD;  Location: [facility] ORS;  Service: Ophthalmology;  Laterality: Left;  [place] 01:41 AP% 25.4 CDE 45.18 fluid pack lot # [id number]  . CORONARY ANGIOPLASTY WITH STENT PLACEMENT    . EYE SURGERY     cataract  . KNEE ARTHROSCOPY Right 2001  . LEFT HEART CATH  09/2009  . TONSILLECTOMY      History reviewed. No pertinent family history.  Social History:  reports that she has never smoked. She does not have any smokeless tobacco history on file. She reports that she does not drink alcohol or use drugs.  Allergies:  Allergies  Allergen Reactions  . Bactrim   . Sulfamethoxazole-Trimethoprim     REACTION: rash    Medications: I have reviewed the patient's current medications.  Results for orders placed or performed during the hospital encounter of 02/13/16 (from the past 48  hour(s))  Basic metabolic panel     Status: Abnormal   Collection Time: 02/13/16  8:43 AM  Result Value Ref Range   Sodium 141 135 - 145 mmol/L   Potassium 4.3 3.5 - 5.1 mmol/L   Chloride 110 101 - 111 mmol/L   CO2 24 22 - 32 mmol/L   Glucose, Bld 88 65 - 99 mg/dL   BUN 19 6 - 20 mg/dL   Creatinine, Ser 1.05 (H) 0.44 - 1.00 mg/dL   Calcium 9.8 8.9 - 10.3 mg/dL   GFR calc non Af Amer 46 (L) >60 mL/min   GFR calc Af Amer 54 (L) >60 mL/min    Comment: (NOTE) The eGFR has been calculated using the CKD EPI equation. This calculation has not been validated in all clinical situations. eGFR's persistently <60 mL/min signify possible Chronic Kidney Disease.    Anion gap 7 5 - 15  CBC WITH DIFFERENTIAL     Status: None   Collection Time: 02/13/16  8:43 AM  Result Value Ref Range   WBC 6.2 4.0 - 10.5 K/uL   RBC 4.73 3.87 - 5.11 MIL/uL   Hemoglobin 14.3 12.0 - 15.0 g/dL   HCT 45.0 36.0 - 46.0 %   MCV 95.1 78.0 - 100.0 fL   MCH 30.2 26.0 - 34.0 pg   MCHC 31.8 30.0 - 36.0 g/dL   RDW 13.3 11.5 - 15.5 %   Platelets 170 150 - 400 K/uL   Neutrophils Relative % 63 %  Neutro Abs 3.9 1.7 - 7.7 K/uL   Lymphocytes Relative 27 %   Lymphs Abs 1.7 0.7 - 4.0 K/uL   Monocytes Relative 7 %   Monocytes Absolute 0.4 0.1 - 1.0 K/uL   Eosinophils Relative 3 %   Eosinophils Absolute 0.2 0.0 - 0.7 K/uL   Basophils Relative 0 %   Basophils Absolute 0.0 0.0 - 0.1 K/uL  Protime-INR     Status: None   Collection Time: 02/13/16  8:43 AM  Result Value Ref Range   Prothrombin Time 13.2 11.4 - 15.2 seconds   INR 1.00   Type and screen [place]     Status: None (Preliminary result)   Collection Time: 02/13/16  8:50 AM  Result Value Ref Range   ABO/RH(D) B POS    Antibody Screen PENDING    Sample Expiration 02/16/2016     Dg Chest 1 View  Result Date: 02/13/2016 CLINICAL DATA:  Fall EXAM: CHEST 1 VIEW COMPARISON:  10/09/2009 FINDINGS: Lungs are clear.  No pleural effusion or  pneumothorax. The heart is mildly enlarged. Thoracic aortic atherosclerosis. Degenerative changes of the bilateral shoulders, right greater than left. IMPRESSION: No evidence of acute cardiopulmonary disease. Thoracic aortic atherosclerosis. Electronically Signed   By: Julian Hy M.D.   On: 02/13/2016 09:56   Dg Hip Unilat With Pelvis 2-3 Views Left  Result Date: 02/13/2016 CLINICAL DATA:  Fall EXAM: DG HIP (WITH OR WITHOUT PELVIS) 2-3V LEFT COMPARISON:  None. FINDINGS: Intertrochanteric left hip fracture. Varus angulation with foreshortening. Bilobed hip joint spaces are symmetric. Visualized bony pelvis appears intact. Degenerative changes of the pubic symphysis. Degenerative changes of the lower lumbar spine. IMPRESSION: Intertrochanteric left hip fracture, as above. Electronically Signed   By: Julian Hy M.D.   On: 02/13/2016 09:55    Review of Systems  Constitutional: Negative for chills and fever.  Respiratory: Negative.   Cardiovascular: Positive for leg swelling.       Positive for hypertension and hyperlipidemia.  Gastrointestinal: Negative.   Genitourinary: Negative.   Musculoskeletal:       Wasn't for follow with acute left hip pain.  Psychiatric/Behavioral: Negative for substance abuse.   Blood pressure 129/69, pulse 74, temperature 97.3 F (36.3 C), temperature source Oral, resp. rate 16, SpO2 90 %. Physical Exam  Constitutional: She is oriented to person, place, and time. She appears well-developed and well-nourished.  HENT:  Head: Normocephalic and atraumatic.  Eyes: Conjunctivae are normal. Pupils are equal, round, and reactive to light.  Neck: Normal range of motion. Neck supple.  Cardiovascular: Normal rate and regular rhythm.   Respiratory: Effort normal. She has no wheezes.  GI: Soft. She exhibits no distension. There is no tenderness.  Musculoskeletal:  Left lower extremity short and externally rotated. Sciatic function is intact distal pulses are  intact right and left. Mild edema both lower extremities.  Neurological: She is alert and oriented to person, place, and time.  Psychiatric: She has a normal mood and affect. Thought content normal.    Assessment/Plan: Left closed intertrochanteric hip fracture with shortening and varus angulation. Patient 0 family also provided the history. She's been nothing by mouth since yesterday. Plan hip stabilization with trochanteric nail for intertrochanteric hip fracture. Risks of surgery discussed including bleeding, infection reoperation, risks of anesthesia heart attack stroke and death. Operative versus nonoperative treatment options were discussed.. Questions were elicited and answered ,patient understands and agrees to proceed  [name] 02/13/2016, 12:36 PM

## 2016-02-13 NOTE — Brief Op Note (Signed)
02/13/2016  2:42 PM  PATIENT:  Claudell Kyle  82 y.o. female  PRE-OPERATIVE DIAGNOSIS:  left intertrochanter fracture  POST-OPERATIVE DIAGNOSIS:  left intertyrochanter fracture  PROCEDURE:  Procedure(s): INTRAMEDULLARY (IM) NAIL INTERTROCHANTRIC (SHORT) (Left)  SURGEON:  Surgeon(s) and Role:    * [name], MD - Primary  PHYSICIAN ASSISTANT: none  ASSISTANTS: none   ANESTHESIA:   spinal  EBL:  Total I/O In: 600 [I.V.:600] Out: 75 [Blood:75]  BLOOD ADMINISTERED:none  DRAINS: none   LOCAL MEDICATIONS USED:  NONE  SPECIMEN:  No Specimen  DISPOSITION OF SPECIMEN:  N/A  COUNTS:  YES  TOURNIQUET:  * No tourniquets in log *  DICTATION: .Dragon Dictation  PLAN OF CARE: Admit to inpatient   PATIENT DISPOSITION:  PACU - hemodynamically stable.   Delay start of Pharmacological VTE agent (>24hrs) due to surgical blood loss or risk of bleeding: yes

## 2016-02-13 NOTE — Anesthesia Procedure Notes (Signed)
Spinal  Patient location during procedure: OR Start time: 02/13/2016 1:36 PM End time: 02/13/2016 1:40 PM Staffing Anesthesiologist: Gabrial Poppell Preanesthetic Checklist Completed: patient identified, surgical consent, pre-op evaluation, timeout performed, IV checked, risks and benefits discussed and monitors and equipment checked Spinal Block Patient position: left lateral decubitus Prep: site prepped and draped and DuraPrep Patient monitoring: heart rate, cardiac monitor, continuous pulse ox and blood pressure Approach: midline Location: L4-5 Injection technique: single-shot Needle Needle type: Pencan  Needle gauge: 24 G Needle length: 10 cm Assessment Sensory level: T6

## 2016-02-13 NOTE — Interval H&P Note (Signed)
History and Physical Interval Note:  02/13/2016 12:45 PM  Kendra Stevenson  has presented today for surgery, with the diagnosis of left intertrochanter fracture  The various methods of treatment have been discussed with the patient and family. After consideration of risks, benefits and other options for treatment, the patient has consented to  Procedure(s): INTRAMEDULLARY (IM) NAIL INTERTROCHANTRIC (SHORT) (Left) as a surgical intervention .  The patient's history has been reviewed, patient examined, no change in status, stable for surgery.  I have reviewed the patient's chart and labs.  Questions were answered to the patient's satisfaction.     [name]

## 2016-02-13 NOTE — H&P (Signed)
Date: 02/13/2016               Patient Name:  Kendra Stevenson MRN: [id number]  DOB: 1926/07/22 Age / Sex: 82 y.o., female   PCP: Kendra Sake, MD         Medical Service: Internal Medicine Teaching Service         Attending Physician: Dr. Dorie Rank, MD    First Contact: Dr. Ophelia Shoulder Pager: [phone]  Second Contact: Dr. Berline Lopes Pager: [phone]       After Hours (After 5p/  First Contact Pager: [phone]  weekends / holidays): Second Contact Pager: [phone]   Chief Complaint: Left hip fracture  History of Present Illness: Kendra Stevenson is a an extremely healthy 82 year old female with few comorbidities except for hypertension, hyperlipidemia and coronary artery disease who presents after a mechanical fall earlier this morning. The patient states that she was walking and tried to put on a shoe and tripped over a change in the flooring causing her to fall and her left side. She did not hit her head or lose consciousness. She noticed severe left hip pain which prompted her to come to the emergency department. She denies any headaches or changes in vision. She denies chest pain or shortness of breath. She denies nausea, vomiting or abdominal pain. She denies diarrhea and she does endorse mild daily constipation. She denies dysuria or polyuria. She denies fevers, chills, night sweats or weight loss. She has no additional complaints today.  In the emergency department the patient was found to be afebrile and hemodynamically stable. Basic metabolic panel was significant for creatinine of 1.05 which appears to be the patient's baseline. CBC was entirely within normal limits. INR was appropriate at 1. Diagnostic left hip imaging demonstrated an  intertrochanteric left hip fracture. Chest radiograph showed no acute cardiopulmonary abnormality. She was given 0.5 mg of Dilaudid for pain and orthopedic surgery was consulted. She will go to the operating room for ORIF this afternoon.  Meds:    Current Meds  Medication Sig  . amLODipine (NORVASC) 5 MG tablet Take 1 tablet (5 mg total) by mouth daily.  Marland Kitchen aspirin 81 MG tablet Take 81 mg by mouth daily.    . calcium citrate-vitamin D (CITRACAL+D) 315-200 MG-UNIT per tablet Take 1 tablet by mouth daily.    Marland Kitchen co-enzyme Q-10 30 MG capsule Take 30 mg by mouth at bedtime.  Marland Kitchen lisinopril (PRINIVIL,ZESTRIL) 20 MG tablet Take 20 mg by mouth every morning.  . Multiple Vitamin (MULTIVITAMIN) tablet Take 1 tablet by mouth daily.    . nitroGLYCERIN (NITROSTAT) 0.4 MG SL tablet Place 1 tablet (0.4 mg total) under the tongue every 5 (five) minutes as needed.  . pravastatin (PRAVACHOL) 40 MG tablet Take 40 mg by mouth daily.       Allergies: Allergies as of 02/13/2016 - Review Complete 02/13/2016  Allergen Reaction Noted  . Bactrim    . Sulfamethoxazole-trimethoprim     Past Medical History:  Diagnosis Date  . Arthritis   . Bilateral swelling of feet    and legs  . CAD 10/20/2009  . Cancer ([place])    skin  . HYPERLIPIDEMIA 10/20/2009  . HYPERTENSION 10/20/2009  . TIA (transient ischemic attack)       Social History: Denies tobacco, alcohol or illicit drug use  Review of Systems: A complete ROS was negative except as per HPI.   Physical Exam: Blood pressure 123/60, pulse (!) 56, temperature 97.3 F (  36.3 C), temperature source Oral, resp. rate 16, SpO2 94 %. Physical Exam  Constitutional: She is oriented to person, place, and time. She appears well-developed and well-nourished.  HENT:  Head: Normocephalic and atraumatic.  Eyes:  Pinpoint pupils  Cardiovascular: Regular rhythm.  Exam reveals no gallop and no friction rub.   No murmur heard. Bradycardic on examination  Respiratory: Effort normal and breath sounds normal. No respiratory distress. She has no wheezes.  GI: Soft. Bowel sounds are normal. She exhibits no distension. There is no tenderness.  Musculoskeletal:  Left foot rotated laterally consistent with  intertrochanteric femur fracture  Neurological: She is alert and oriented to person, place, and time.     EKG: Ordered, has not been performed yet  CXR: No acute cardiopulmonary abnormality  Assessment & Plan by Problem: Active Problems:   Closed intertrochanteric fracture of hip, left, initial encounter [facility])  Kendra Stevenson is an 82 year old female with few comorbidities who presents after mechanical fall with a left intertrochanteric femur fracture.   # Left intertrochanteric fracture -- ORIF by orthopedic surgery this afternoon -- 0.5 mg IV Dilaudid every 2 hours as needed for pain. -- Physical therapy consult and evaluation to be done tomorrow -- Normal saline 125 mL per hour -- Incentive spirometry postop  # Hypertension # Coronary artery disease Currently normotensive. We'll hold medications until after surgery. -- Monitor clinically -- Hold home amlodipine and lisinopril -- Hold home aspirin -- Hold home pravastatin  # Bradycardia Patient was bradycardic on examination. EKG has been ordered but has not resulted yet. --Follow-up EKG   DVT/PE prophylaxis: SCDs FEN/GI: Nothing by mouth for surgery Code: Full code  Dispo: Admit patient to Inpatient with expected length of stay greater than 2 midnights.  Signed: Ophelia Shoulder, MD 02/13/2016, 11:17 AM  Pager: [phone]

## 2016-02-13 NOTE — Anesthesia Procedure Notes (Signed)
Date/Time: 02/13/2016 1:31 PM Performed by: Marinda Elk A Pre-anesthesia Checklist: Patient identified, Emergency Drugs available, Suction available, Patient being monitored and Timeout performed Oxygen Delivery Method: Nasal cannula

## 2016-02-13 NOTE — ED Triage Notes (Signed)
Pt arrives from home via [place] after having a fall after standing up from her couch. Pt states she has an  "inner ear problem" and loses her balance frequently. Pt has c/o left hip pain and EMS reports deformity at the hip joint and shortening and rotation of the left leg.

## 2016-02-13 NOTE — Transfer of Care (Signed)
Immediate Anesthesia Transfer of Care Note  Patient: Kendra Stevenson  Procedure(s) Performed: Procedure(s): INTRAMEDULLARY (IM) NAIL INTERTROCHANTRIC (SHORT) (Left)  Patient Location: PACU  Anesthesia Type:Spinal  Level of Consciousness: awake  Airway & Oxygen Therapy: Patient Spontanous Breathing and Patient connected to nasal cannula oxygen  Post-op Assessment: Report given to RN and Post -op Vital signs reviewed and stable  Post vital signs: Reviewed and stable  Last Vitals:  Vitals:   02/13/16 1115 02/13/16 1157  BP: 97/68 129/69  Pulse: 63 74  Resp:  16  Temp:      Last Pain:  Vitals:   02/13/16 1134  TempSrc:   PainSc: 5          Complications: No apparent anesthesia complications

## 2016-02-13 NOTE — Anesthesia Postprocedure Evaluation (Signed)
Anesthesia Post Note  Patient: Kendra Stevenson  Procedure(s) Performed: Procedure(s) (LRB): INTRAMEDULLARY (IM) NAIL INTERTROCHANTRIC (SHORT) (Left)  Patient location during evaluation: PACU Anesthesia Type: Spinal Level of consciousness: awake and alert Pain management: pain level controlled Vital Signs Assessment: post-procedure vital signs reviewed and stable Respiratory status: spontaneous breathing, respiratory function stable and patient connected to nasal cannula oxygen Cardiovascular status: stable Postop Assessment: no signs of nausea or vomiting Anesthetic complications: no       Last Vitals:  Vitals:   02/13/16 1750 02/13/16 1953  BP: 110/60 125/67  Pulse: 80 77  Resp: 16 16  Temp: 36.7 C 37.4 C    Last Pain:  Vitals:   02/13/16 2018  TempSrc:   PainSc: 10-Worst pain ever                 An Schnabel

## 2016-02-14 DIAGNOSIS — Z79899 Other long term (current) drug therapy: Secondary | ICD-10-CM

## 2016-02-14 DIAGNOSIS — R001 Bradycardia, unspecified: Secondary | ICD-10-CM

## 2016-02-14 DIAGNOSIS — Z7982 Long term (current) use of aspirin: Secondary | ICD-10-CM

## 2016-02-14 DIAGNOSIS — S72142D Displaced intertrochanteric fracture of left femur, subsequent encounter for closed fracture with routine healing: Secondary | ICD-10-CM

## 2016-02-14 DIAGNOSIS — W010XXD Fall on same level from slipping, tripping and stumbling without subsequent striking against object, subsequent encounter: Secondary | ICD-10-CM

## 2016-02-14 DIAGNOSIS — I2510 Atherosclerotic heart disease of native coronary artery without angina pectoris: Secondary | ICD-10-CM

## 2016-02-14 DIAGNOSIS — M810 Age-related osteoporosis without current pathological fracture: Secondary | ICD-10-CM | POA: Diagnosis present

## 2016-02-14 DIAGNOSIS — Z881 Allergy status to other antibiotic agents status: Secondary | ICD-10-CM

## 2016-02-14 DIAGNOSIS — I10 Essential (primary) hypertension: Secondary | ICD-10-CM

## 2016-02-14 DIAGNOSIS — Z882 Allergy status to sulfonamides status: Secondary | ICD-10-CM

## 2016-02-14 LAB — BASIC METABOLIC PANEL
ANION GAP: 4 — AB (ref 5–15)
BUN: 18 mg/dL (ref 6–20)
CHLORIDE: 108 mmol/L (ref 101–111)
CO2: 26 mmol/L (ref 22–32)
Calcium: 8.8 mg/dL — ABNORMAL LOW (ref 8.9–10.3)
Creatinine, Ser: 1.22 mg/dL — ABNORMAL HIGH (ref 0.44–1.00)
GFR calc Af Amer: 45 mL/min — ABNORMAL LOW (ref >60)
GFR calc non Af Amer: 39 mL/min — ABNORMAL LOW (ref >60)
GLUCOSE: 110 mg/dL — AB (ref 65–99)
POTASSIUM: 4.5 mmol/L (ref 3.5–5.1)
Sodium: 138 mmol/L (ref 135–145)

## 2016-02-14 LAB — TYPE AND SCREEN
ABO/RH(D): B POS
ANTIBODY SCREEN: POSITIVE
DAT, IGG: NEGATIVE
PT AG TYPE: NEGATIVE

## 2016-02-14 LAB — CBC
HEMATOCRIT: 31 % — AB (ref 36.0–46.0)
HEMOGLOBIN: 9.9 g/dL — AB (ref 12.0–15.0)
MCH: 29.6 pg (ref 26.0–34.0)
MCHC: 31.9 g/dL (ref 30.0–36.0)
MCV: 92.8 fL (ref 78.0–100.0)
Platelets: 132 10*3/uL — ABNORMAL LOW (ref 150–400)
RBC: 3.34 MIL/uL — AB (ref 3.87–5.11)
RDW: 13.2 % (ref 11.5–15.5)
WBC: 8.2 10*3/uL (ref 4.0–10.5)

## 2016-02-14 MED ORDER — SENNA 8.6 MG PO TABS
1.0000 | ORAL_TABLET | Freq: Every day | ORAL | Status: DC
  Administered 2016-02-14 – 2016-02-16 (×3): 8.6 mg via ORAL
  Filled 2016-02-14 (×3): qty 1

## 2016-02-14 NOTE — Progress Notes (Signed)
   Subjective: No acute events overnight. POD #1 afebrile and HDS. Pain well controlled. Denies n/v or abdominal pain. Is passing gas. No bowel movement yet.  Objective:  Vital signs in last 24 hours: Vitals:   02/13/16 2300 02/14/16 0020 02/14/16 0352 02/14/16 0432  BP:  (!) 103/51  (!) 95/52  Pulse:  70  72  Resp:  16  16  Temp:  98.8 F (37.1 C)  99 F (37.2 C)  TempSrc:  Oral  Oral  SpO2: 95% 93% 94% 90%   Physical Exam  Constitutional: She is oriented to person, place, and time. She appears well-developed and well-nourished.  HENT:  Head: Normocephalic and atraumatic.  Cardiovascular: Normal rate and regular rhythm.  Exam reveals no gallop and no friction rub.   No murmur heard. Respiratory: Effort normal and breath sounds normal. No respiratory distress. She has no wheezes.  GI: Soft. Bowel sounds are normal. She exhibits no distension. There is no tenderness.  Musculoskeletal: She exhibits no edema.  Neurological: She is alert and oriented to person, place, and time.     Assessment/Plan:  Active Problems:   Closed intertrochanteric fracture of hip, left, initial encounter [facility])   Intertrochanteric fracture of left hip [facility])  Ms. Watson is an 82 year old female with few comorbidities who presents after mechanical fall with a left intertrochanteric femur fracture.   # Left intertrochanteric fracture POD #1 s/p ORIF. Doing well. Afebrile and HDS. Pain well controlled. -- 0.5 mg IV Dilaudid every 2 hours as needed for pain. -- Physical therapy consult and evaluation  -- Normal saline 75 mL per hour -- Incentive spirometry  # Hypertension # Coronary artery disease Currently normotensive. We'll hold medications until after surgery. -- Monitor clinically -- Hold home amlodipine and lisinopril -- Hold home aspirin -- Hold home pravastatin  # Osteoperosis Patient with fragility fracture.  -- Will need weekly bisphosphonate   # Acute Kidney Injury Patient  had rise in Cr following surgery. I suspect this is prerenal and will improve with IV hydration. Currently, 1.22 from 1.05 on admission. -- Continue to monitor -- Daily BMP   DVT/PE prophylaxis: SCDs, per surgery can resume pharmacological VTE prophylaxis 24hrs s/p surgery  FEN/GI: Regular Diet  Code: Full code  Dispo: Anticipated discharge in 1-2 days.  Ophelia Shoulder, MD 02/14/2016, 8:01 AM Pager: [phone]

## 2016-02-14 NOTE — Evaluation (Signed)
Physical Therapy Evaluation Patient Details Name: Kendra Stevenson MRN: [id number] DOB: [birth year] Today's Date: 02/14/2016   History of Present Illness  82 yo female admitted through ED following fall in her home resulting in Left hip fracture. Pt underwent an IM nailing on 02/13/16 and is now WBAT. PMH significant for HTN, HLD, CAD, and TIA.   Clinical Impression  Pt is POD 1 following the above procedure resulting from a fall in her home. Pt was getting up to go to her kitchen and is unsure if she tripped on her shoes or something in the floor. Prior to admission, pt lived with her disabled son in a single level home. Pt was completely independent including light meal prep, light housekeeping and driving herself to run errands. Pt presents with the below deficits and will benefit from continuing to be seen acutely in order to maximize functional outcomes. Pt will benefit from short term rehab at discharge unless she is able to improve her mobility prior to discharge.     Follow Up Recommendations SNF    Equipment Recommendations  Other (comment);None recommended by PT (defer to next venue of care)    Recommendations for Other Services       Precautions / Restrictions Precautions Precautions: Fall Precaution Comments: pt had a fall resulting in fracture Restrictions Weight Bearing Restrictions: Yes LLE Weight Bearing: Weight bearing as tolerated      Mobility  Bed Mobility Overal bed mobility: Needs Assistance Bed Mobility: Supine to Sit     Supine to sit: Min assist;Mod assist     General bed mobility comments: Min A to move LE's EOB and Mod A to bring trunk up to EOB.   Transfers Overall transfer level: Needs assistance Equipment used: Rolling walker (2 wheeled) Transfers: Sit to/from [facility] Sit to Stand: Min assist Stand pivot transfers: Min assist       General transfer comment: Min A from EOB to RW and Min A throughout transfer from bed to  chair with cues for proper foot placement.   Ambulation/Gait                Stairs            Wheelchair Mobility    Modified Rankin (Stroke Patients Only)       Balance Overall balance assessment: Needs assistance Sitting-balance support: No upper extremity supported;Feet supported Sitting balance-Leahy Scale: Fair Sitting balance - Comments: Sitting EOB no back support   Standing balance support: Bilateral upper extremity supported Standing balance-Leahy Scale: Poor Standing balance comment: Relies on Rw for support in standing                             Pertinent Vitals/Pain Pain Assessment: 0-10 Pain Score: 5  Pain Location: left hip Pain Descriptors / Indicators: Discomfort;Guarding;Throbbing Pain Intervention(s): Monitored during session;RN gave pain meds during session;Ice applied    Home Living Family/patient expects to be discharged to:: Skilled nursing facility                 Additional Comments: Pt lives at home with her disabled son. Family is very close and would be available to assist, but pt's best option for recovery may be SNF.     Prior Function Level of Independence: Independent with assistive device(s)         Comments: Used [place] or RW for mobility throughout home and in community. Was driving and performing light housekeeping  and running errands.      Hand Dominance   Dominant Hand: Right    Extremity/Trunk Assessment   Upper Extremity Assessment Upper Extremity Assessment: Defer to OT evaluation    Lower Extremity Assessment Lower Extremity Assessment: LLE deficits/detail LLE Deficits / Details: Pt with normal post op pain and weakness. At least 3/5 ankle and 2/5 knee and hip per gross functional assessment       Communication   Communication: No difficulties  Cognition Arousal/Alertness: Awake/alert Behavior During Therapy: WFL for tasks assessed/performed Overall Cognitive Status: Within Functional  Limits for tasks assessed                      General Comments General comments (skin integrity, edema, etc.): Daughter is present throughout assessment and in agreement to therapy reccomendations.    Exercises Total Joint Exercises Ankle Circles/Pumps: AROM;Both;20 reps;Supine Quad Sets: AROM;Left;10 reps;Supine   Assessment/Plan    PT Assessment Patient needs continued PT services  PT Problem List Decreased strength;Decreased range of motion;Decreased activity tolerance;Decreased balance;Decreased mobility;Decreased knowledge of use of DME;Pain          PT Treatment Interventions DME instruction;Gait training;Functional mobility training;Therapeutic activities;Therapeutic exercise;Balance training;Patient/family education    PT Goals (Current goals can be found in the Care Plan section)  Acute Rehab PT Goals Patient Stated Goal: to get back home PT Goal Formulation: With patient/family Time For Goal Achievement: 02/21/16 Potential to Achieve Goals: Good    Frequency Min 3X/week   Barriers to discharge        Co-evaluation               End of Session Equipment Utilized During Treatment: Gait belt Activity Tolerance: Patient tolerated treatment well;Patient limited by pain Patient left: in chair;with call bell/phone within reach;with family/visitor present Nurse Communication: Mobility status         Time: [id number] PT Time Calculation (min) (ACUTE ONLY): 39 min   Charges:   PT Evaluation $PT Eval Moderate Complexity: 1 Procedure PT Treatments $Therapeutic Activity: 8-22 mins   PT G Codes:        Scheryl Marten PT, DPT  [phone]  02/14/2016, 2:59 PM

## 2016-02-14 NOTE — H&P (Signed)
Internal Medicine Attending Admission Note  I saw and evaluated the patient. I reviewed the resident's note and I agree with the resident's findings and plan as documented in the resident's note.  Assessment & Plan by Problem:   Intertrochanteric fracture of left hip (HCC) - POD 1 from ORIF with IM nail - Continue Pain control with low dose dilaudid - Add senna for bowel regimen with narcotics. -encourage IS use    Osteoporosis - Reports previous Dexa scan, unknown results.  Never taken any therapy for bone health.  She appears to be a good candidate for oral bisphosphonate therapy after an osteoporotic fx.. -Check Vitamin D with am labs.  DVT PPx: lovenox  Chief Complaint(s):fall  History - key components related to admission:  Briefly Kendra Stevenson is a 82 yo female with history of HTN, HLD, CAD who fell from standing position yesterday while attempting to put on a shoe.  Post fall she had left hip pain.  In the ED her left leg was noted to be shortened and externally roated, Xrays confirmed intertrochanteric femur fracture on the left.  She was taken yesterday for ORIF with Dr Lorin Mercy.  She has never had a fracture before.  She has never taken any medications for bone health.  She denies other complaints.  Lab results: Reviewed in Epic  Physical Exam - key components related to admission: General: resting in bed HEENT: EOMI, no scleral icterus Cardiac: RRR, no rubs, murmurs or gallops Pulm: clear to auscultation bilaterally, moving normal volumes of air Abd: soft, nontender, nondistended, BS present Ext: bandage c/d/i Neuro: alert and oriented X3  Vitals:   02/13/16 2300 02/14/16 0020 02/14/16 0352 02/14/16 0432  BP:  (!) 103/51  (!) 95/52  Pulse:  70  72  Resp:  16  16  Temp:  98.8 F (37.1 C)  99 F (37.2 C)  TempSrc:  Oral  Oral  SpO2: 95% 93% 94% 90%

## 2016-02-14 NOTE — Progress Notes (Signed)
   Subjective: 1 Day Post-Op Procedure(s) (LRB): INTRAMEDULLARY (IM) NAIL INTERTROCHANTRIC (SHORT) (Left) Patient reports pain as mild.    Objective: Vital signs in last 24 hours: Temp:  [98.1 F (36.7 C)-99.7 F (37.6 C)] 99.7 F (37.6 C) (12/24 1317) Pulse Rate:  [70-95] 95 (12/24 1317) Resp:  [16-22] 16 (12/24 1317) BP: (95-125)/(51-67) 105/59 (12/24 1317) SpO2:  [88 %-98 %] 98 % (12/24 1317)  Intake/Output from previous day: 12/23 0701 - 12/24 0700 In: 1658.8 [P.O.:400; I.V.:1258.8] Out: 675 [Urine:600; Blood:75] Intake/Output this shift: No intake/output data recorded.   Recent Labs  02/13/16 0843 02/14/16 0622  HGB 14.3 9.9*    Recent Labs  02/13/16 0843 02/14/16 0622  WBC 6.2 8.2  RBC 4.73 3.34*  HCT 45.0 31.0*  PLT 170 132*    Recent Labs  02/13/16 0843 02/14/16 0622  NA 141 138  K 4.3 4.5  CL 110 108  CO2 24 26  BUN 19 18  CREATININE 1.05* 1.22*  GLUCOSE 88 110*  CALCIUM 9.8 8.8*    Recent Labs  02/13/16 0843  INR 1.00    Neurologically intact No results found.  Assessment/Plan: 1 Day Post-Op Procedure(s) (LRB): INTRAMEDULLARY (IM) NAIL INTERTROCHANTRIC (SHORT) (Left) Plan:  Continue PT, SL IV. Dressing change   Kendra Stevenson 02/14/2016, 4:45 PM

## 2016-02-15 ENCOUNTER — Encounter (HOSPITAL_COMMUNITY): Payer: Self-pay | Admitting: *Deleted

## 2016-02-15 LAB — CBC
HEMATOCRIT: 28.7 % — AB (ref 36.0–46.0)
HEMOGLOBIN: 9.1 g/dL — AB (ref 12.0–15.0)
MCH: 30 pg (ref 26.0–34.0)
MCHC: 31.7 g/dL (ref 30.0–36.0)
MCV: 94.7 fL (ref 78.0–100.0)
Platelets: 118 10*3/uL — ABNORMAL LOW (ref 150–400)
RBC: 3.03 MIL/uL — AB (ref 3.87–5.11)
RDW: 13.5 % (ref 11.5–15.5)
WBC: 8.4 10*3/uL (ref 4.0–10.5)

## 2016-02-15 LAB — BASIC METABOLIC PANEL
Anion gap: 3 — ABNORMAL LOW (ref 5–15)
BUN: 20 mg/dL (ref 6–20)
CHLORIDE: 108 mmol/L (ref 101–111)
CO2: 28 mmol/L (ref 22–32)
CREATININE: 1.22 mg/dL — AB (ref 0.44–1.00)
Calcium: 9 mg/dL (ref 8.9–10.3)
GFR calc non Af Amer: 39 mL/min — ABNORMAL LOW (ref >60)
GFR, EST AFRICAN AMERICAN: 45 mL/min — AB (ref >60)
Glucose, Bld: 98 mg/dL (ref 65–99)
POTASSIUM: 4.4 mmol/L (ref 3.5–5.1)
SODIUM: 139 mmol/L (ref 135–145)

## 2016-02-15 MED ORDER — NITROGLYCERIN 0.4 MG SL SUBL: 0.4000 mg | SUBLINGUAL_TABLET | SUBLINGUAL | Status: DC | PRN

## 2016-02-15 MED ORDER — CALCIUM CARBONATE-VITAMIN D 500-200 MG-UNIT PO TABS
1.0000 | ORAL_TABLET | Freq: Every day | ORAL | Status: DC
  Administered 2016-02-15 – 2016-02-16 (×2): 1 via ORAL
  Filled 2016-02-15 (×2): qty 1

## 2016-02-15 MED ORDER — CALCIUM CITRATE-VITAMIN D 315-200 MG-UNIT PO TABS: 1.0000 | ORAL_TABLET | Freq: Every day | ORAL | Status: DC

## 2016-02-15 MED ORDER — ASPIRIN EC 81 MG PO TBEC
81.0000 mg | DELAYED_RELEASE_TABLET | Freq: Every day | ORAL | Status: DC
  Administered 2016-02-15 – 2016-02-16 (×2): 81 mg via ORAL
  Filled 2016-02-15 (×2): qty 1

## 2016-02-15 MED ORDER — PRAVASTATIN SODIUM 40 MG PO TABS
40.0000 mg | ORAL_TABLET | Freq: Every day | ORAL | Status: DC
  Administered 2016-02-15 – 2016-02-16 (×2): 40 mg via ORAL
  Filled 2016-02-15 (×2): qty 1

## 2016-02-15 MED ORDER — ADULT MULTIVITAMIN W/MINERALS CH
1.0000 | ORAL_TABLET | Freq: Every day | ORAL | Status: DC
  Administered 2016-02-15 – 2016-02-16 (×2): 1 via ORAL
  Filled 2016-02-15 (×2): qty 1

## 2016-02-15 MED ORDER — CALCIUM CITRATE-VITAMIN D 500-400 MG-UNIT PO CHEW
1.0000 | CHEWABLE_TABLET | Freq: Every day | ORAL | Status: DC
  Filled 2016-02-15: qty 1

## 2016-02-15 MED ORDER — COENZYME Q10 30 MG PO CAPS: 30.0000 mg | ORAL_CAPSULE | Freq: Every day | ORAL | Status: DC

## 2016-02-15 NOTE — Progress Notes (Signed)
   Subjective: 2 Days Post-Op Procedure(s) (LRB): INTRAMEDULLARY (IM) NAIL INTERTROCHANTRIC (SHORT) (Left) Patient reports pain as moderate.    Objective: Vital signs in last 24 hours: Temp:  [98.4 F (36.9 C)-99.7 F (37.6 C)] 98.6 F (37 C) (12/25 0510) Pulse Rate:  [67-95] 67 (12/25 0510) Resp:  [16-18] 18 (12/25 0510) BP: (98-107)/(58-59) 98/58 (12/25 0510) SpO2:  [87 %-98 %] 92 % (12/25 0600)  Intake/Output from previous day: 12/24 0701 - 12/25 0700 In: 100 [P.O.:100] Out: -  Intake/Output this shift: No intake/output data recorded.   Recent Labs  02/13/16 0843 02/14/16 0622 02/15/16 0456  HGB 14.3 9.9* 9.1*    Recent Labs  02/14/16 0622 02/15/16 0456  WBC 8.2 8.4  RBC 3.34* 3.03*  HCT 31.0* 28.7*  PLT 132* 118*    Recent Labs  02/14/16 0622 02/15/16 0456  NA 138 139  K 4.5 4.4  CL 108 108  CO2 26 28  BUN 18 20  CREATININE 1.22* 1.22*  GLUCOSE 110* 98  CALCIUM 8.8* 9.0    Recent Labs  02/13/16 0843  INR 1.00    Neurologically intact, dressing dry No results found.  Assessment/Plan: 2 Days Post-Op Procedure(s) (LRB): INTRAMEDULLARY (IM) NAIL INTERTROCHANTRIC (SHORT) (Left) Up with therapy  [name] 02/15/2016, 12:48 PM

## 2016-02-15 NOTE — NC FL2 (Signed)
[place] LEVEL OF CARE SCREENING TOOL     IDENTIFICATION  Patient Name: Kendra Stevenson Birthdate: 11/29/1926 Sex: female Admission Date (Current Location): 02/13/2016  [facility] and [place] Number:  Herbalist and Address:  [facility] [place], [place] [address] [place], [place] [address]      Provider Number: [id number]  Attending Physician Name and Address:  Lucious Groves, DO  Relative Name and Phone Number:  Regino Schultze, daughter, [phone]    Current Level of Care: Hospital Recommended Level of Care: [place] Prior Approval Number:    Date Approved/Denied:   PASRR Number:   [id number]   Discharge Plan: SNF    Current Diagnoses: Patient Active Problem List   Diagnosis Date Noted  . Osteoporosis 02/14/2016  . Closed intertrochanteric fracture of hip, left, initial encounter ([place]) 02/13/2016  . Intertrochanteric fracture of left hip ([place]) 02/13/2016  . Closed fracture of left hip ([place])   . Edema extremities 06/04/2010  . HYPERLIPIDEMIA 10/20/2009  . HYPERTENSION 10/20/2009  . CAD 10/20/2009    Orientation RESPIRATION BLADDER Height & Weight     Self, Time, Situation, Place  O2-Nasal Cannula 2L Continent Weight:   Height:     BEHAVIORAL SYMPTOMS/MOOD NEUROLOGICAL BOWEL NUTRITION STATUS      Continent Diet (Please see DC Summary)  AMBULATORY STATUS COMMUNICATION OF NEEDS Skin   Extensive Assist Verbally Surgical wounds (Closed incision on eye and hip)                       Personal Care Assistance Level of Assistance  Bathing, Feeding, Dressing Bathing Assistance: Maximum assistance Feeding assistance: Independent Dressing Assistance: Limited assistance     Functional Limitations Info             SPECIAL CARE FACTORS FREQUENCY  PT (By licensed PT)     PT Frequency: 5x/week              Contractures      Additional Factors Info  Code Status, Allergies Code Status Info:  Full Allergies Info: Bactrim, Sulfamethoxazole-trimethoprim           Current Medications (02/15/2016):  This is the current hospital active medication list Current Facility-Administered Medications  Medication Dose Route Frequency Provider Last Rate Last Dose  . docusate sodium (COLACE) capsule 100 mg  100 mg Oral BID [name], MD   100 mg at 02/15/16 [phone]  . enoxaparin (LOVENOX) injection 30 mg  30 mg Subcutaneous Q24H [name], MD   30 mg at 02/15/16 0844  . HYDROcodone-acetaminophen (NORCO/VICODIN) 5-325 MG per tablet 1-2 tablet  1-2 tablet Oral Q6H PRN [name], MD   1 tablet at 02/14/16 2337  . HYDROmorphone (DILAUDID) injection 0.5 mg  0.5 mg Intravenous Q2H PRN Shela Leff, MD      . menthol-cetylpyridinium (CEPACOL) lozenge 3 mg  1 lozenge Oral PRN [name], MD       Or  . phenol (CHLORASEPTIC) mouth spray 1 spray  1 spray Mouth/Throat PRN [name], MD      . metoCLOPramide (REGLAN) tablet 5-10 mg  5-10 mg Oral Q8H PRN [name], MD       Or  . metoCLOPramide (REGLAN) injection 5-10 mg  5-10 mg Intravenous Q8H PRN [name], MD      . morphine 2 MG/ML injection 2 mg  2 mg Intravenous Q2H PRN [name], MD  2 mg at 02/13/16 2013  . ondansetron (ZOFRAN) tablet 4 mg  4 mg Oral Q6H PRN [name], MD       Or  . ondansetron [facility]) injection 4 mg  4 mg Intravenous Q6H PRN [name], MD      . polyethylene glycol (MIRALAX / GLYCOLAX) packet 17 g  17 g Oral Daily PRN [name], MD      . senna [facility]) tablet 8.6 mg  1 tablet Oral Daily Lucious Groves, DO   8.6 mg at 02/15/16 [id number]     Discharge Medications: Please see discharge summary for a list of discharge medications.  Relevant Imaging Results:  Relevant Lab Results:   Additional Information SSN: [place], [place]

## 2016-02-15 NOTE — Progress Notes (Signed)
OT Note  Pt is Medicare and current D/C plan is SNF. No apparent immediate acute care OT needs, therefore will defer OT to SNF. If OT eval is needed please call Acute Rehab Dept. at [phone] or text page OT at [phone]. Thanks  [facility], OT/L  [id number] 02/15/2016

## 2016-02-15 NOTE — Progress Notes (Signed)
   Subjective: No acute events overnight. POD #2 afebrile and hemodynamically stable. Physical therapy has recommended a short-term stay at a skilled nursing facility to continue to work on rehabilitation and identify deficits.Was able to sit up in chair yesterday. Pain is controlled. Has not had a bowel movement yet.   Objective:  Vital signs in last 24 hours: Vitals:   02/15/16 0510 02/15/16 0530 02/15/16 0531 02/15/16 0600  BP: (!) 98/58     Pulse: 67     Resp: 18     Temp: 98.6 F (37 C)     TempSrc: Oral     SpO2: 97% (!) 87% 96% 92%   Physical Exam  Constitutional: She is oriented to person, place, and time. She appears well-developed and well-nourished.  HENT:  Head: Normocephalic and atraumatic.  Cardiovascular: Normal rate and regular rhythm.  Exam reveals no gallop and no friction rub.   No murmur heard. Respiratory: Effort normal and breath sounds normal. No respiratory distress. She has no wheezes.  GI: Soft. Bowel sounds are normal. She exhibits no distension. There is no tenderness.  Musculoskeletal: She exhibits no edema.  Left foot, internally rotated. Pedal sensation and strength equal bilaterally  Neurological: She is alert and oriented to person, place, and time.     Assessment/Plan:  Principal Problem:   Intertrochanteric fracture of left hip (HCC) Active Problems:   Closed intertrochanteric fracture of hip, left, initial encounter [facility])   Osteoporosis  Kendra Stevenson is an 82 year old female with few comorbidities who presents after mechanical fall with a left intertrochanteric femur fracture.  In summary, patient is postop day #2 status post ORIF and intramedullary nail placement. She is afebrile and hemodynamically stable. She was evaluated by physical therapy who recommended skilled nursing facility placement. Will continue to work on bowel regimen.   # Left intertrochanteric fracture POD #2 s/p ORIF + intramedullary nail. Doing well. Afebrile and  HDS. Pain well controlled.physical therapy has recommended skilled nursing facility placement for short-term intensive rehabilitation. Patient was independent prior to fall and fracture. -- 0.5 mg IV Dilaudid every 2 hours as needed for breakthrough pain  -- Hydrocodone 5-325 q6 hours for pain control -- Physical therapy- recommend skilled nursing facility -- Incentive spirometry  # Hypertension # Coronary artery disease  Some borderline hypotension overnight. She may need to be reevaluated in the outpatient setting as to whether or not she needs continued antihypertensive medications given that she has been normotensive since admission. -- Monitor clinically -- Hold home amlodipine and lisinopril -- Hold home aspirin -- Hold home pravastatin  # Osteoperosis Patient with fragility fracture.  -- follow-up vitamin D level -- Will need weekly bisphosphonate   # Acute Kidney Injury Patient had rise in Cr following surgery. I suspect this is prerenal and will improve with IV hydration. Currently, 1.22 from 1.05 on admission.on repeat basic metabolic panel today her creatinine remains 1.22. It appears she is also had a creatinine of 2.0 in the past. We will continue to monitor as we treat with IV hydration and wait for skilled nursing facility placement. -- Continue to monitor -- Daily BMP   DVT/PE prophylaxis: Lovenox FEN/GI: Regular Diet  Code: Full code  Dispo: Anticipated discharge pending skilled nursing facility placement.  Kendra Stevenson, Kendra Stevenson 02/15/2016, 7:04 AM Pager: [phone]

## 2016-02-16 ENCOUNTER — Encounter (HOSPITAL_COMMUNITY): Payer: Self-pay | Admitting: Orthopaedic Surgery

## 2016-02-16 DIAGNOSIS — S72142D Displaced intertrochanteric fracture of left femur, subsequent encounter for closed fracture with routine healing: Secondary | ICD-10-CM | POA: Diagnosis not present

## 2016-02-16 DIAGNOSIS — R531 Weakness: Secondary | ICD-10-CM | POA: Diagnosis not present

## 2016-02-16 DIAGNOSIS — W19XXXD Unspecified fall, subsequent encounter: Secondary | ICD-10-CM | POA: Diagnosis not present

## 2016-02-16 DIAGNOSIS — W010XXD Fall on same level from slipping, tripping and stumbling without subsequent striking against object, subsequent encounter: Secondary | ICD-10-CM | POA: Diagnosis not present

## 2016-02-16 DIAGNOSIS — R600 Localized edema: Secondary | ICD-10-CM | POA: Diagnosis not present

## 2016-02-16 DIAGNOSIS — E784 Other hyperlipidemia: Secondary | ICD-10-CM | POA: Diagnosis not present

## 2016-02-16 DIAGNOSIS — M6281 Muscle weakness (generalized): Secondary | ICD-10-CM | POA: Diagnosis not present

## 2016-02-16 DIAGNOSIS — G8929 Other chronic pain: Secondary | ICD-10-CM | POA: Diagnosis not present

## 2016-02-16 DIAGNOSIS — S72009A Fracture of unspecified part of neck of unspecified femur, initial encounter for closed fracture: Secondary | ICD-10-CM | POA: Diagnosis not present

## 2016-02-16 DIAGNOSIS — M84459D Pathological fracture, hip, unspecified, subsequent encounter for fracture with routine healing: Secondary | ICD-10-CM | POA: Diagnosis not present

## 2016-02-16 DIAGNOSIS — M545 Low back pain: Secondary | ICD-10-CM | POA: Diagnosis not present

## 2016-02-16 DIAGNOSIS — M47816 Spondylosis without myelopathy or radiculopathy, lumbar region: Secondary | ICD-10-CM | POA: Diagnosis not present

## 2016-02-16 DIAGNOSIS — S72142A Displaced intertrochanteric fracture of left femur, initial encounter for closed fracture: Secondary | ICD-10-CM | POA: Diagnosis not present

## 2016-02-16 DIAGNOSIS — I10 Essential (primary) hypertension: Secondary | ICD-10-CM | POA: Diagnosis not present

## 2016-02-16 DIAGNOSIS — E785 Hyperlipidemia, unspecified: Secondary | ICD-10-CM | POA: Diagnosis not present

## 2016-02-16 DIAGNOSIS — R609 Edema, unspecified: Secondary | ICD-10-CM | POA: Diagnosis not present

## 2016-02-16 DIAGNOSIS — M8000XD Age-related osteoporosis with current pathological fracture, unspecified site, subsequent encounter for fracture with routine healing: Secondary | ICD-10-CM | POA: Diagnosis not present

## 2016-02-16 DIAGNOSIS — R262 Difficulty in walking, not elsewhere classified: Secondary | ICD-10-CM | POA: Diagnosis not present

## 2016-02-16 DIAGNOSIS — M810 Age-related osteoporosis without current pathological fracture: Secondary | ICD-10-CM | POA: Diagnosis not present

## 2016-02-16 LAB — CBC
HCT: 27.7 % — ABNORMAL LOW (ref 36.0–46.0)
Hemoglobin: 8.9 g/dL — ABNORMAL LOW (ref 12.0–15.0)
MCH: 30.5 pg (ref 26.0–34.0)
MCHC: 32.1 g/dL (ref 30.0–36.0)
MCV: 94.9 fL (ref 78.0–100.0)
PLATELETS: 125 10*3/uL — AB (ref 150–400)
RBC: 2.92 MIL/uL — ABNORMAL LOW (ref 3.87–5.11)
RDW: 13.7 % (ref 11.5–15.5)
WBC: 8.7 10*3/uL (ref 4.0–10.5)

## 2016-02-16 LAB — VITAMIN D 25 HYDROXY (VIT D DEFICIENCY, FRACTURES): Vit D, 25-Hydroxy: 37.9 ng/mL (ref 30.0–100.0)

## 2016-02-16 MED ORDER — HYDROCODONE-ACETAMINOPHEN 5-325 MG PO TABS: 1.0000 | ORAL_TABLET | Freq: Four times a day (QID) | ORAL | 0 refills | Status: DC | PRN

## 2016-02-16 MED ORDER — ASPIRIN 81 MG PO TBEC: 81.0000 mg | DELAYED_RELEASE_TABLET | Freq: Every day | ORAL | 1 refills | Status: AC

## 2016-02-16 MED ORDER — DOCUSATE SODIUM 100 MG PO CAPS: 100.0000 mg | ORAL_CAPSULE | Freq: Two times a day (BID) | ORAL | 0 refills | Status: AC

## 2016-02-16 MED ORDER — HYDROCODONE-ACETAMINOPHEN 5-325 MG PO TABS: 1.0000 | ORAL_TABLET | Freq: Four times a day (QID) | ORAL | 0 refills | Status: AC | PRN

## 2016-02-16 MED ORDER — POLYETHYLENE GLYCOL 3350 17 G PO PACK: 17.0000 g | PACK | Freq: Every day | ORAL | 0 refills | Status: AC | PRN

## 2016-02-16 MED ORDER — POLYETHYLENE GLYCOL 3350 17 G PO PACK: 17.0000 g | PACK | Freq: Every day | ORAL | 0 refills | Status: DC | PRN

## 2016-02-16 MED ORDER — DOCUSATE SODIUM 100 MG PO CAPS: 100.0000 mg | ORAL_CAPSULE | Freq: Two times a day (BID) | ORAL | 0 refills | Status: DC

## 2016-02-16 MED ORDER — ASPIRIN 81 MG PO TBEC: 81.0000 mg | DELAYED_RELEASE_TABLET | Freq: Every day | ORAL | 1 refills | Status: DC

## 2016-02-16 NOTE — Progress Notes (Signed)
Physical Therapy Treatment Patient Details Name: Kendra Stevenson MRN: [id number] DOB: 11/03/1926 Today's Date: 02/16/2016    History of Present Illness 82 yo female admitted through ED following fall in her home resulting in Left hip fracture. Pt underwent an IM nailing on 02/13/16 and is now WBAT. PMH significant for HTN, HLD, CAD, and TIA.     PT Comments    Pt demonstrates improved tolerance for gait and transfers this session. Pt with increased c/o fatigue following stand pivot to [facility]. Performed short distance gait around bed with improved weight bearing through LLE. Initiated supine exercises in HEP.    Follow Up Recommendations  SNF     Equipment Recommendations  Other (comment);None recommended by PT (defer to next venue of care)    Recommendations for Other Services       Precautions / Restrictions Precautions Precautions: Fall Precaution Comments: pt had a fall resulting in fracture Restrictions Weight Bearing Restrictions: Yes LLE Weight Bearing: Weight bearing as tolerated    Mobility  Bed Mobility Overal bed mobility: Needs Assistance Bed Mobility: Supine to Sit     Supine to sit: Min assist     General bed mobility comments: Min A to move LEs EOB and to bring trunk upright  Transfers Overall transfer level: Needs assistance Equipment used: Rolling walker (2 wheeled) Transfers: Sit to/from Stand Sit to Stand: Min assist         General transfer comment: Min A from EOB to RW and Min A throughout transfer from bed to chair with cues for proper foot placement.   Ambulation/Gait Ambulation/Gait assistance: Min assist Ambulation Distance (Feet): 15 Feet Assistive device: Rolling walker (2 wheeled) Gait Pattern/deviations: Step-to pattern;Decreased step length - right;Decreased stance time - left;Antalgic Gait velocity: decreased Gait velocity interpretation: Below normal speed for age/gender General Gait Details: Mild antalgic gait, improved gait  distance and min cues for sequencing   Stairs            Wheelchair Mobility    Modified Rankin (Stroke Patients Only)       Balance Overall balance assessment: Needs assistance Sitting-balance support: No upper extremity supported;Feet supported Sitting balance-Leahy Scale: Fair Sitting balance - Comments: Sitting EOB no back support   Standing balance support: Bilateral upper extremity supported Standing balance-Leahy Scale: Poor Standing balance comment: Relies on Rw for support in standing                    Cognition Arousal/Alertness: Awake/alert Behavior During Therapy: WFL for tasks assessed/performed Overall Cognitive Status: Within Functional Limits for tasks assessed                      Exercises Total Joint Exercises Ankle Circles/Pumps: AROM;Both;20 reps;Supine Quad Sets: AROM;Left;10 reps;Supine Heel Slides: AROM;Left;10 reps;Supine Hip ABduction/ADduction: AROM;Left;10 reps;Supine    General Comments        Pertinent Vitals/Pain Pain Assessment: 0-10 Pain Score: 4  Pain Location: left hip Pain Descriptors / Indicators: Discomfort;Guarding;Throbbing Pain Intervention(s): Monitored during session;Limited activity within patient's tolerance;Premedicated before session    Home Living                      Prior Function            PT Goals (current goals can now be found in the care plan section) Acute Rehab PT Goals Patient Stated Goal: to get back home Progress towards PT goals: Progressing toward goals    Frequency  Min 3X/week      PT Plan Current plan remains appropriate    Co-evaluation             End of Session Equipment Utilized During Treatment: Gait belt Activity Tolerance: Patient tolerated treatment well;Patient limited by pain Patient left: in bed;with call bell/phone within reach;with SCD's reapplied     Time: [id number] PT Time Calculation (min) (ACUTE ONLY): 31 min  Charges:   $Gait Training: 8-22 mins $Therapeutic Exercise: 8-22 mins                    G Codes:      Scheryl Marten PT, DPT  [phone]  02/16/2016, 12:40 PM

## 2016-02-16 NOTE — Clinical Social Work Placement (Signed)
   CLINICAL SOCIAL WORK PLACEMENT  NOTE  Date:  02/16/2016  Patient Details  Name: Kendra Stevenson MRN: [id number] Date of Birth: 15-Jul-1926  Clinical Social Work is seeking post-discharge placement for this patient at the [place] level of care (*CSW will initial, date and re-position this form in  chart as items are completed):      Patient/family provided with [place] Work Department's list of facilities offering this level of care within the geographic area requested by the patient (or if unable, by the patient's family).  Yes   Patient/family informed of their freedom to choose among providers that offer the needed level of care, that participate in Medicare, Medicaid or managed care program needed by the patient, have an available bed and are willing to accept the patient.      Patient/family informed of [facility]'s ownership interest in [facility] and [facility], as well as of the fact that they are under no obligation to receive care at these facilities.  PASRR submitted to EDS on 02/15/16     PASRR number received on 02/15/16     Existing PASRR number confirmed on       FL2 transmitted to all facilities in geographic area requested by pt/family on 02/15/16     FL2 transmitted to all facilities within larger geographic area on       Patient informed that his/her managed care company has contracts with or will negotiate with certain facilities, including the following:        Yes   Patient/family informed of bed offers received.  Patient chooses bed at [facility] and [place] recommends and patient chooses bed at      Patient to be transferred to [facility] on 02/16/16.  Patient to be transferred to facility by [facility]     Patient family notified on 02/16/16 of transfer.  Name of family member notified:  Gladys     PHYSICIAN Please sign FL2, Please prepare priority discharge summary,  including medications     Additional Comment:    _______________________________________________ Alla German, LCSW 02/16/2016, 11:36 AM

## 2016-02-16 NOTE — Progress Notes (Signed)
   Subjective: 3 Days Post-Op Procedure(s) (LRB): INTRAMEDULLARY (IM) NAIL INTERTROCHANTRIC (SHORT) (Left) Patient reports pain as mild.    Objective: Vital signs in last 24 hours: Temp:  [98.3 F (36.8 C)-99.6 F (37.6 C)] 98.3 F (36.8 C) (12/26 0426) Pulse Rate:  [77-89] 77 (12/26 0426) Resp:  [16] 16 (12/26 0426) BP: (105-125)/(53-57) 125/56 (12/26 0426) SpO2:  [90 %-95 %] 90 % (12/26 0426)  Intake/Output from previous day: 12/25 0701 - 12/26 0700 In: 712 [P.O.:712] Out: -  Intake/Output this shift: Total I/O In: 120 [P.O.:120] Out: -    Recent Labs  02/14/16 0622 02/15/16 0456 02/16/16 0410  HGB 9.9* 9.1* 8.9*    Recent Labs  02/15/16 0456 02/16/16 0410  WBC 8.4 8.7  RBC 3.03* 2.92*  HCT 28.7* 27.7*  PLT 118* 125*    Recent Labs  02/14/16 0622 02/15/16 0456  NA 138 139  K 4.5 4.4  CL 108 108  CO2 26 28  BUN 18 20  CREATININE 1.22* 1.22*  GLUCOSE 110* 98  CALCIUM 8.8* 9.0   No results for input(s): LABPT, INR in the last 72 hours.  Neurologically intact No results found.  Assessment/Plan: 3 Days Post-Op Procedure(s) (LRB): INTRAMEDULLARY (IM) NAIL INTERTROCHANTRIC (SHORT) (Left) Up with therapy Discharge to SNF.    Dr. Lovena Le already put Rx for pain. She is WBAT, office followup with me in 2 wks.     My cell [phone]  [name] 02/16/2016, 11:14 AM

## 2016-02-16 NOTE — Discharge Summary (Signed)
Name: Kendra Stevenson MRN: [id number] DOB: 1927-01-13 82 y.o. PCP: Leonides Sake, MD  Date of Admission: 02/13/2016  8:27 AM Date of Discharge: 02/16/2016 Attending Physician: Lucious Groves, DO  Discharge Diagnosis: 1. Intertrochanteric fracture of the left hip 2. Osteoporosis 3. Hypertension Principal Problem:   Intertrochanteric fracture of left hip [facility]) Active Problems:   Closed intertrochanteric fracture of hip, left, initial encounter [facility])   Osteoporosis   Discharge Medications: Allergies as of 02/16/2016      Reactions   Bactrim    Sulfamethoxazole-trimethoprim    REACTION: rash      Medication List    STOP taking these medications   amLODipine 5 MG tablet Commonly known as:  NORVASC   aspirin 81 MG tablet Replaced by:  aspirin 81 MG EC tablet   lisinopril 20 MG tablet Commonly known as:  PRINIVIL,ZESTRIL     TAKE these medications   aspirin 325 MG tablet Commonly known as:  BAYER ASPIRIN Take 1 tablet (325 mg total) by mouth daily.   aspirin 81 MG EC tablet Take 1 tablet (81 mg total) by mouth daily. Replaces:  aspirin 81 MG tablet   calcium citrate-vitamin D 315-200 MG-UNIT tablet Commonly known as:  CITRACAL+D Take 1 tablet by mouth daily.   co-enzyme Q-10 30 MG capsule Take 30 mg by mouth at bedtime.   docusate sodium 100 MG capsule Commonly known as:  COLACE Take 1 capsule (100 mg total) by mouth 2 (two) times daily.   HYDROcodone-acetaminophen 5-325 MG tablet Commonly known as:  NORCO Take 1 tablet by mouth every 6 (six) hours as needed for moderate pain.   HYDROcodone-acetaminophen 5-325 MG tablet Commonly known as:  NORCO Take 1-2 tablets by mouth every 6 (six) hours as needed for moderate pain. 1 pill for moderate pain, 2 for severe pain   multivitamin tablet Take 1 tablet by mouth daily.   nitroGLYCERIN 0.4 MG SL tablet Commonly known as:  NITROSTAT Place 1 tablet (0.4 mg total) under the tongue every 5 (five) minutes as  needed.   polyethylene glycol packet Commonly known as:  MIRALAX / GLYCOLAX Take 17 g by mouth daily as needed for mild constipation.   pravastatin 40 MG tablet Commonly known as:  PRAVACHOL Take 40 mg by mouth daily.       Disposition and follow-up:   Ms.Kendra Stevenson was discharged from [facility] in Good condition.  At the hospital follow up visit please address:  1.  I would consider starting bisphosphonate therapy in 4-6 weeks. Please reassess the patient's need for antihypertensive medication.She remained normotensive with some episodes of hypotension on service. On discharge we discontinued the use of her antihypertensive medications and I would advise reconsidering these in the outpatient setting.  2.  Labs / imaging needed at time of follow-up: None  3.  Pending labs/ test needing follow-up: Vitamin D level  Follow-up Appointments: Follow-up Information    [name], MD Follow up in 2 week(s).   Specialty:  Orthopedic Surgery Contact information: [place] [address] [place] Hospital Course by problem list: Principal Problem:   Intertrochanteric fracture of left hip [facility]) Active Problems:   Closed intertrochanteric fracture of hip, left, initial encounter ([place])   Osteoporosis   1. Intertrochanteric fracture of the left hip The patient presented to the [facility] emergency department on 02/13/2016 with left hip pain following a mechanical  fall. The patient had a mechanical fall at her home. She fell on her left hip. She did not hit her head or lose consciousness. Imaging in the emergency department revealed a left intertrochanteric fracture. Orthopedic surgery was consulted and evaluated the patient. She underwent open reduction and internal fixation with intramedullary rod placement. The operation was without complication. She recovered appropriately in the postoperative setting. Her pain was  controlled and managed on oral pain medication only. She will have follow-up with orthopedic surgery clinic. On the day of discharge the patient's pain was controlled, she was afebrile and he was hemodynamically stable. Physical therapy recommended skilled nursing facility placement for short-term rehabilitation.   2. Osteoporosis The patient presents with an intertrochanteric fracture following a mechanical injury. This fracture in the setting of this mechanism would classify as a fragility fracture. A fragility fracture would meet diagnostic criteria for osteoporosis without DEXA imaging. She will benefit from bisphosphonate therapy once weekly in the outpatient setting. The exact time is 2tostart bisphosphonate therapy following fracture and surgical intervention as debated in the literature. However, based on the known mechanisms of bisphosphonate therapy I would suggest starting the patient on bisphosphonate therapy in approximately 4-6 weeks as to not interfere with normal bone healing processes. Remember, when administering this medication the patient must be sedated in the upright position, drink plenty of water and stay in that position for a minimum of 20 minutes to avoid erosive esophagitis  3. Hypertension  The patient has a history of hypertension however she remained normotensive with some episodes of hypotension while in the hospital. At this time I question whether she needs to continue with antihypertensive medications. I do have some concern that if she has borderline hypotension this would predispose her to orthostatic hypotension or syncope and could lead to another fall. On discharge I will not restart the patient's blood pressure medications. Please reassess the need for these medications in the outpatient setting and start them as appropriate. Discharge Vitals:   BP (!) 125/56 (BP Location: Right Arm)   Pulse 77   Temp 98.3 F (36.8 C) (Oral)   Resp 16   SpO2 90%   Pertinent  Labs, Studies, and Procedures:  1. Left hip radiograph-intertrochanteric fracture 2. Status post ORIF with intramedullary nail placement  Discharge Instructions: Discharge Instructions    Diet - low sodium heart healthy    Complete by:  As directed    Discharge instructions    Complete by:  As directed    You had a fall and broke your left hip. This was repaired by surgery. Please work with physical therapy at the skilled nursing facility.  Please take your medications as prescribed. Also, please ensure you follow-up with your orthopedic surgeon in clinic as scheduled.  I have given you a medication for pain control. You can take 1 pill every 4-6 hours as needed for pain.   Increase activity slowly    Complete by:  As directed    Weight bearing as tolerated    Complete by:  As directed    Laterality:  left      Signed: Ophelia Shoulder, MD 02/16/2016, 8:32 AM   Pager: [phone]

## 2016-02-16 NOTE — Progress Notes (Signed)
Report called to Ashely-RN at [facility]. All questions/conerns addressed. [facility] to transport

## 2016-02-16 NOTE — Clinical Social Work Note (Addendum)
Clinical Social Worker facilitated patient discharge including contacting patient family and facility to confirm patient discharge plans.  Clinical information faxed to facility and family agreeable with plan. Patient's daughter is going to do paperwork with facility at 1:00. CSW arranged ambulance transport (for 2:00) via [facility] to [facility] [place] .  RN to call [phone] station 1 for report prior to discharge.  Clinical Social Worker will sign off for now as social work intervention is no longer needed. Please consult [place] again if new need arises.  [address], [place]

## 2016-02-16 NOTE — Progress Notes (Signed)
Dressing changed to left hip per order.  Incisions x3, clean dry and Intact with staples.  Aquacel dressing reapplied.

## 2016-02-16 NOTE — Clinical Social Work Note (Signed)
Pt daughter, Regino Schultze number is [phone]. Contact information wrong on the facesheet.  [address], [place]

## 2016-02-16 NOTE — Clinical Social Work Note (Signed)
Clinical Social Work Assessment  Patient Details  Name: Kendra Stevenson MRN: [id number] Date of Birth: [birth year]  Date of referral:  02/16/16               Reason for consult:  Facility Placement                Permission sought to share information with:  Family Supports Permission granted to share information::  Yes, Verbal Permission Granted  Name::     Loss adjuster, chartered::     Relationship::  Daughter  Contact Information:     Housing/Transportation Living arrangements for the past 2 months:  Single Family Home Source of Information:  Patient Patient Interpreter Needed:  None Criminal Activity/Legal Involvement Pertinent to Current Situation/Hospitalization:  No - Comment as needed Significant Relationships:  Adult Children Lives with:  Adult Children Do you feel safe going back to the place where you live?  Yes Need for family participation in patient care:  Yes (Comment)  Care giving concerns:  No family or friends at bedside at this time. Pt granted verbal permission for CSW to reach out to her daughter for decision on which facility pt will go to.   Social Worker assessment / plan:  CSW spoke with pt at bedside. Pt was alert and oriented. Pt is agreeable to SNF placement at this time. CSW provided bed offers. Pt suggest that CSW reach out to her daughter as she will probably want pt to go to a facility close to her home so she can go and visit the pt. CSW will reach out to pt's daughter and determine placement decision. Pt will go by [facility].  Employment status:  Retired Forensic scientist:  Medicare PT Recommendations:  [place] / Referral to community resources:  [place]  Patient/Family's Response to care:  Pt verbalized understanding of CSW role and appreciation of support. Pt denies any concern regarding her care at this time.  Patient/Family's Understanding of and Emotional Response to Diagnosis, Current Treatment, and  Prognosis:  Pt understanding and realistic regarding physical limitations. Pt is agreeable to SNF placement at discharge at this time. Pt denies any concern or questions regarding treatment plan at this time. CSW will continue to provide support to pt.  Emotional Assessment Appearance:  Appears stated age Attitude/Demeanor/Rapport:   (Patient was appropriate.) Affect (typically observed):  Accepting, Appropriate, Calm Orientation:  Oriented to Self, Oriented to  Time, Oriented to Place, Oriented to Situation Alcohol / Substance use:  Not Applicable Psych involvement (Current and /or in the community):  No (Comment)  Discharge Needs  Concerns to be addressed:  No discharge needs identified Readmission within the last 30 days:  No Current discharge risk:  Dependent with Mobility Barriers to Discharge:  Continued Medical Work up   [employer], LCSW 02/16/2016, 9:34 AM

## 2016-02-16 NOTE — Progress Notes (Signed)
Subjective: No acute events overnight. POD #3 afebrile and hemodynamically stable. She does endorse mild pain at rest and moderate pain when moving. She denies nausea, vomiting or abdominal pain. She is passing gas but remains constipated. I encouraged her to request pain medications when her pain gets more severe as they're ordered as when necessary. Objective:  Vital signs in last 24 hours: Vitals:   02/15/16 0600 02/15/16 1500 02/15/16 2027 02/16/16 0426  BP:  (!) 115/53 (!) 105/57 (!) 125/56  Pulse:  89 87 77  Resp:  16 16 16   Temp:  99.6 F (37.6 C) 99.1 F (37.3 C) 98.3 F (36.8 C)  TempSrc:  Oral Oral Oral  SpO2: 92% 93% 95% 90%   Physical Exam  Constitutional: She is oriented to person, place, and time. She appears well-developed and well-nourished.  In no acute distress, sitting up in bed eating  HENT:  Head: Normocephalic and atraumatic.  Cardiovascular: Normal rate and regular rhythm.  Exam reveals no gallop and no friction rub.   No murmur heard. Respiratory: Effort normal and breath sounds normal. No respiratory distress. She has no wheezes.  GI: Soft. Bowel sounds are normal. She exhibits no distension. There is no tenderness.  Musculoskeletal: She exhibits no edema.  Neurological: She is alert and oriented to person, place, and time.     Assessment/Plan:  Principal Problem:   Intertrochanteric fracture of left hip (HCC) Active Problems:   Closed intertrochanteric fracture of hip, left, initial encounter [facility])   Osteoporosis  Ms. Fahl is an 82 year old female with few comorbidities who presents after mechanical fall with a left intertrochanteric femur fracture.  In summary, patient is postop day #3 from ORIF with intramedullary nail placement. She remained afebrile and hemodynamically stable. Her pain is only moderately controlled. However, on review the patient's last dose of pain medicine was yesterday evening around 2100 hrs. I reminded her that her  medicines are ordered on a when necessary basis and that all she has to do his request medicine when she is having pain. She endorsed understanding of this.   # Left intertrochanteric fracture POD #3 s/p ORIF + intramedullary nail. Doing well. Afebrile and HDS. -- Hydrocodone 5-325 q6 hours for pain control -- Physical therapy- recommend skilled nursing facility -- Incentive spirometry  # Hypertension # Coronary artery disease  Some borderline hypotension overnight. She may need to be reevaluated in the outpatient setting as to whether or not she needs continued antihypertensive medications given that she has been normotensive since admission. -- Monitor clinically -- Hold home amlodipine and lisinopril -- Hold home aspirin -- Hold home pravastatin  # Osteoperosis Patient with fragility fracture. The exact time as to when to start a bisphosphonate following a fragility fracture or surgical intervention is debated in the literature. However, based on my understanding of the mechanism of bisphosphonate therapy  in normal bone healing I would suggest starting the patient on a bisphosphonate in approximately 4-6 weeks. -- follow-up vitamin D level -- Will need weekly bisphosphonate- consider starting in 4-6 weeks  # Acute Kidney Injury Patient had rise in Cr following surgery. I suspect this is prerenal and will improve with IV hydration. Currently, 1.22 from 1.05 on admission.on repeat basic metabolic panel today her creatinine remains 1.22. It appears she is also had a creatinine of 2.0 in the past. We will continue to monitor as we treat with IV hydration and wait for skilled nursing facility placement. -- Continue to monitor   DVT/PE prophylaxis:  Lovenox FEN/GI: Regular Diet  Code: Full code  Dispo: Anticipated discharge pending skilled nursing facility placement.  Ophelia Shoulder, MD 02/16/2016, 7:59 AM Pager: [phone]

## 2016-02-17 DIAGNOSIS — I10 Essential (primary) hypertension: Secondary | ICD-10-CM | POA: Diagnosis not present

## 2016-02-17 DIAGNOSIS — S72142D Displaced intertrochanteric fracture of left femur, subsequent encounter for closed fracture with routine healing: Secondary | ICD-10-CM | POA: Diagnosis not present

## 2016-02-17 DIAGNOSIS — M8000XD Age-related osteoporosis with current pathological fracture, unspecified site, subsequent encounter for fracture with routine healing: Secondary | ICD-10-CM | POA: Diagnosis not present

## 2016-02-17 DIAGNOSIS — E784 Other hyperlipidemia: Secondary | ICD-10-CM | POA: Diagnosis not present

## 2016-02-18 ENCOUNTER — Telehealth (INDEPENDENT_AMBULATORY_CARE_PROVIDER_SITE_OTHER): Payer: Self-pay | Admitting: Orthopaedic Surgery

## 2016-02-18 NOTE — Telephone Encounter (Signed)
Could you please put on Dr. Lorin Mercy schedule on 02/26/2015/8 at 1pm? She is post op. Thanks so much for your help.

## 2016-02-18 NOTE — Telephone Encounter (Signed)
I called and spoke with Lenna Sciara, patient's nurse. She states that there is increased bloody drainage on the bandage. After speaking with Dr. Lorin Mercy, I advised Melissa to change dressing and apply dry dressing. She does not believe patient is actively bleeding.  Also scheduled follow up appt.

## 2016-02-24 ENCOUNTER — Inpatient Hospital Stay (INDEPENDENT_AMBULATORY_CARE_PROVIDER_SITE_OTHER): Payer: Medicare Other | Admitting: Orthopaedic Surgery

## 2016-02-24 DIAGNOSIS — M8000XD Age-related osteoporosis with current pathological fracture, unspecified site, subsequent encounter for fracture with routine healing: Secondary | ICD-10-CM | POA: Diagnosis not present

## 2016-02-24 DIAGNOSIS — R609 Edema, unspecified: Secondary | ICD-10-CM | POA: Diagnosis not present

## 2016-02-24 DIAGNOSIS — S72142D Displaced intertrochanteric fracture of left femur, subsequent encounter for closed fracture with routine healing: Secondary | ICD-10-CM | POA: Diagnosis not present

## 2016-02-24 DIAGNOSIS — I10 Essential (primary) hypertension: Secondary | ICD-10-CM | POA: Diagnosis not present

## 2016-02-26 ENCOUNTER — Other Ambulatory Visit: Payer: Self-pay | Admitting: *Deleted

## 2016-02-26 ENCOUNTER — Ambulatory Visit (INDEPENDENT_AMBULATORY_CARE_PROVIDER_SITE_OTHER): Payer: No Typology Code available for payment source

## 2016-02-26 ENCOUNTER — Encounter (INDEPENDENT_AMBULATORY_CARE_PROVIDER_SITE_OTHER): Payer: Self-pay | Admitting: Orthopaedic Surgery

## 2016-02-26 ENCOUNTER — Inpatient Hospital Stay (INDEPENDENT_AMBULATORY_CARE_PROVIDER_SITE_OTHER): Payer: Medicare Other | Admitting: Orthopaedic Surgery

## 2016-02-26 ENCOUNTER — Ambulatory Visit (INDEPENDENT_AMBULATORY_CARE_PROVIDER_SITE_OTHER): Payer: Medicare Other | Admitting: Orthopaedic Surgery

## 2016-02-26 VITALS — BP 124/76 | HR 95 | Ht 65.0 in | Wt 140.0 lb

## 2016-02-26 DIAGNOSIS — S72142D Displaced intertrochanteric fracture of left femur, subsequent encounter for closed fracture with routine healing: Secondary | ICD-10-CM | POA: Diagnosis not present

## 2016-02-26 DIAGNOSIS — M545 Low back pain, unspecified: Secondary | ICD-10-CM | POA: Insufficient documentation

## 2016-02-26 NOTE — Progress Notes (Signed)
Office Visit Note   Patient: Kendra Stevenson           Date of Birth: 06-Nov-1926           MRN: [id number] Visit Date: 02/26/2016              Requested by: Leonides Sake, MD [place], [place] [address] PCP: Leonides Sake, MD   Assessment & Plan: Visit Diagnoses:  1. Closed displaced intertrochanteric fracture of left femur with routine healing, subsequent encounter   2. Low back pain, unspecified back pain laterality, unspecified chronicity, with sciatica presence unspecified     Plan: Return office visit 1 month repeat 2 view x-ray left hip AP and frog-leg on return. She is making good progress with therapy.  Follow-Up Instructions: No Follow-up on file.   Orders:  Orders Placed This Encounter  Procedures  . XR HIP UNILAT W OR W/O PELVIS 2-3 VIEWS LEFT  . XR Lumbar Spine 2-3 Views   No orders of the defined types were placed in this encounter.     Procedures: No procedures performed   Clinical Data: No additional findings.   Subjective: Chief Complaint  Patient presents with  . Left Hip - Routine Post Op, Pain  . Lower Back - Pain    Patient comes in for one week 6 day post op visit, status post left IM nail short, intertroch fracture.  She is having pain when she moves her leg out to the side. The pain is more down in the leg toward in the knee. She has had swelling in the right leg as well. She states that she had an ultrasound at the [facility] yesterday and she is negative for DVT/  She is also having increased low back pain since she fell. She had mobile x-rays made of her back, but they have not received any results.    Review of Systems 14 4 views systems updated unchanged from her hospitalization   Objective: Vital Signs: BP 124/76   Pulse 95   Ht 5\' 5"  (1.651 m)   Wt 140 lb (63.5 kg)   BMI 23.30 kg/m   Physical Exam staples harvested from her hip incision. She has bilateral pitting pedal edema with venous discoloration  pretibial bilaterally. She's had the support stockings that she is warm for years and needs to get back to wearing them.  Ortho Exam  Specialty Comments:  No specialty comments available.  Imaging: Xr Hip Unilat W Or W/o Pelvis 2-3 Views Left  Result Date: 02/26/2016 AP frog-leg x-ray left hip obtained. This shows trochanteric nail short with proximal distal interlock in satisfactory position. She's had slight subsidence of the fracture. Impression post intertrochanteric hip fracture nail with satisfactory postop position and interval healing.  Xr Lumbar Spine 2-3 Views  Result Date: 02/26/2016 Lumbar x-ray shows significant scoliosis the left lumbar curve. No acute fractures noted no spondylolisthesis. AP and lateral x-rays are reviewed. Assessment: Lumbar x-rays negative for acute fracture she does have scoliosis with facet arthropathy and spurring.    PMFS History: Patient Active Problem List   Diagnosis Date Noted  . Low back pain 02/26/2016  . Osteoporosis 02/14/2016  . Closed intertrochanteric fracture of hip, left, initial encounter ([place]) 02/13/2016  . Intertrochanteric fracture of left hip ([place]) 02/13/2016  . Closed fracture of left hip ([place])   . Edema extremities 06/04/2010  . HYPERLIPIDEMIA 10/20/2009  . HYPERTENSION 10/20/2009  . CAD 10/20/2009   Past Medical History:  Diagnosis Date  . Arthritis   . Bilateral swelling of feet    and legs  . CAD 10/20/2009  . Cancer ([place])    skin  . HYPERLIPIDEMIA 10/20/2009  . HYPERTENSION 10/20/2009  . TIA (transient ischemic attack)     No family history on file.  Past Surgical History:  Procedure Laterality Date  . ABDOMINAL HYSTERECTOMY     partial  . APPENDECTOMY    . CATARACT EXTRACTION W/PHACO Left 07/06/2015   Procedure: CATARACT EXTRACTION PHACO AND INTRAOCULAR LENS PLACEMENT (IOC);  Surgeon: Estill Cotta, MD;  Location: [facility] ORS;  Service: Ophthalmology;  Laterality: Left;  [place] 01:41 AP% 25.4 CDE 45.18 fluid  pack lot # [id number]  . CORONARY ANGIOPLASTY WITH STENT PLACEMENT    . EYE SURGERY     cataract  . INTRAMEDULLARY (IM) NAIL INTERTROCHANTERIC Left 02/13/2016   Procedure: INTRAMEDULLARY (IM) NAIL INTERTROCHANTRIC (SHORT);  Surgeon: [name], MD;  Location: [place];  Service: Orthopedics;  Laterality: Left;  . KNEE ARTHROSCOPY Right 2001  . LEFT HEART CATH  09/2009  . TONSILLECTOMY     Social History   Occupational History  . Not on file.   Social History Main Topics  . Smoking status: Never Smoker  . Smokeless tobacco: Not on file  . Alcohol use No  . Drug use: No  . Sexual activity: Not on file

## 2016-02-26 NOTE — Patient Outreach (Signed)
[facility]) Care Management  02/26/2016  Kendra Stevenson [birth year] [id number]   Met with [name], RN and discharge planner for [facility].  Reviewed patient. Patient has history of CAD, HLD, HTN, recent left hip pinning. Patient lives with son, who provides care.  Patient plans to discharge 03/16/16.  No [facility] care management needs assessed. Plan, RNCM will sign off, can be re consulted if discharge planning needs arise.  Royetta Crochet. Laymond Purser, RN, BSN, CCM  [facility] coordinator [phone]

## 2016-03-01 ENCOUNTER — Inpatient Hospital Stay (INDEPENDENT_AMBULATORY_CARE_PROVIDER_SITE_OTHER): Payer: Medicare Other | Admitting: Orthopaedic Surgery

## 2016-03-02 DIAGNOSIS — M545 Low back pain: Secondary | ICD-10-CM | POA: Diagnosis not present

## 2016-03-02 DIAGNOSIS — G8929 Other chronic pain: Secondary | ICD-10-CM | POA: Diagnosis not present

## 2016-03-02 DIAGNOSIS — E785 Hyperlipidemia, unspecified: Secondary | ICD-10-CM | POA: Diagnosis not present

## 2016-03-02 DIAGNOSIS — I10 Essential (primary) hypertension: Secondary | ICD-10-CM | POA: Diagnosis not present

## 2016-03-04 DIAGNOSIS — E785 Hyperlipidemia, unspecified: Secondary | ICD-10-CM | POA: Diagnosis not present

## 2016-03-04 DIAGNOSIS — I10 Essential (primary) hypertension: Secondary | ICD-10-CM | POA: Diagnosis not present

## 2016-03-04 DIAGNOSIS — M8000XD Age-related osteoporosis with current pathological fracture, unspecified site, subsequent encounter for fracture with routine healing: Secondary | ICD-10-CM | POA: Diagnosis not present

## 2016-03-04 DIAGNOSIS — R609 Edema, unspecified: Secondary | ICD-10-CM | POA: Diagnosis not present

## 2016-03-29 ENCOUNTER — Ambulatory Visit (INDEPENDENT_AMBULATORY_CARE_PROVIDER_SITE_OTHER): Payer: Medicare Other | Admitting: Orthopaedic Surgery

## 2016-04-12 ENCOUNTER — Ambulatory Visit (INDEPENDENT_AMBULATORY_CARE_PROVIDER_SITE_OTHER): Payer: Medicare Other

## 2016-04-12 ENCOUNTER — Encounter (INDEPENDENT_AMBULATORY_CARE_PROVIDER_SITE_OTHER): Payer: Self-pay | Admitting: Orthopaedic Surgery

## 2016-04-12 ENCOUNTER — Ambulatory Visit (INDEPENDENT_AMBULATORY_CARE_PROVIDER_SITE_OTHER): Payer: Medicare Other | Admitting: Orthopaedic Surgery

## 2016-04-12 VITALS — BP 138/68 | HR 66

## 2016-04-12 DIAGNOSIS — S72142A Displaced intertrochanteric fracture of left femur, initial encounter for closed fracture: Secondary | ICD-10-CM

## 2016-04-12 DIAGNOSIS — M25552 Pain in left hip: Secondary | ICD-10-CM

## 2016-04-12 NOTE — Progress Notes (Signed)
   Post-Op Visit Note   Patient: Kendra Stevenson           Date of Birth: 08/17/1926           MRN: [id number] Visit Date: 04/12/2016 PCP: Leonides Sake, MD   Assessment & Plan:  Chief Complaint: No chief complaint on file.  Visit Diagnoses:  1. Pain in left hip     Plan: Post 02/13/2016 left intertrochanteric fracture. She is immature the walker when she lays on her left side sometimes bothers her chest rollover or sometimes may wake her up.  Follow-Up Instructions: She can return on a when necessary basis. She has some prominence of the compression screw which is slightly more prominent as the fracture settled. It's now healed in good position. I discussed with her she can return in 6 months if it's still bothering her we can discuss removal of the screw to take care of the problem.  Orders:  Orders Placed This Encounter  Procedures  . XR HIP UNILAT W OR W/O PELVIS 1V LEFT   No orders of the defined types were placed in this encounter.  HPI Patient returns for follow up. She is status post IM nail left hip on 02/13/2016. She states that she still has soreness when she is up and walking, but she is doing well.   Imaging: No results found.  PMFS History: Patient Active Problem List   Diagnosis Date Noted  . Low back pain 02/26/2016  . Osteoporosis 02/14/2016  . Closed intertrochanteric fracture of hip, left, initial encounter ([place]) 02/13/2016  . Intertrochanteric fracture of left hip ([place]) 02/13/2016  . Closed fracture of left hip ([place])   . Edema extremities 06/04/2010  . HYPERLIPIDEMIA 10/20/2009  . HYPERTENSION 10/20/2009  . CAD 10/20/2009   Past Medical History:  Diagnosis Date  . Arthritis   . Bilateral swelling of feet    and legs  . CAD 10/20/2009  . Cancer ([place])    skin  . HYPERLIPIDEMIA 10/20/2009  . HYPERTENSION 10/20/2009  . TIA (transient ischemic attack)     No family history on file.  Past Surgical History:  Procedure Laterality Date  .  ABDOMINAL HYSTERECTOMY     partial  . APPENDECTOMY    . CATARACT EXTRACTION W/PHACO Left 07/06/2015   Procedure: CATARACT EXTRACTION PHACO AND INTRAOCULAR LENS PLACEMENT (IOC);  Surgeon: Estill Cotta, MD;  Location: [facility];  Service: Ophthalmology;  Laterality: Left;  [place] 01:41 AP% 25.4 CDE 45.18 fluid pack lot # [id number]  . CORONARY ANGIOPLASTY WITH STENT PLACEMENT    . EYE SURGERY     cataract  . INTRAMEDULLARY (IM) NAIL INTERTROCHANTERIC Left 02/13/2016   Procedure: INTRAMEDULLARY (IM) NAIL INTERTROCHANTRIC (SHORT);  Surgeon: [name], MD;  Location: [facility];  Service: Orthopedics;  Laterality: Left;  . KNEE ARTHROSCOPY Right 2001  . LEFT HEART CATH  09/2009  . TONSILLECTOMY     Social History   Occupational History  . Not on file.   Social History Main Topics  . Smoking status: Never Smoker  . Smokeless tobacco: Not on file  . Alcohol use No  . Drug use: No  . Sexual activity: Not on file

## 2016-06-03 DIAGNOSIS — M170 Bilateral primary osteoarthritis of knee: Secondary | ICD-10-CM | POA: Diagnosis not present

## 2016-06-03 DIAGNOSIS — G8929 Other chronic pain: Secondary | ICD-10-CM | POA: Diagnosis not present

## 2016-06-07 DIAGNOSIS — X32XXXD Exposure to sunlight, subsequent encounter: Secondary | ICD-10-CM | POA: Diagnosis not present

## 2016-06-07 DIAGNOSIS — L570 Actinic keratosis: Secondary | ICD-10-CM | POA: Diagnosis not present

## 2016-07-19 DIAGNOSIS — I2510 Atherosclerotic heart disease of native coronary artery without angina pectoris: Secondary | ICD-10-CM | POA: Diagnosis not present

## 2016-07-19 DIAGNOSIS — I10 Essential (primary) hypertension: Secondary | ICD-10-CM | POA: Diagnosis not present

## 2016-07-19 DIAGNOSIS — E7800 Pure hypercholesterolemia, unspecified: Secondary | ICD-10-CM | POA: Diagnosis not present

## 2016-07-22 DIAGNOSIS — Z6827 Body mass index (BMI) 27.0-27.9, adult: Secondary | ICD-10-CM | POA: Diagnosis not present

## 2016-07-22 DIAGNOSIS — E663 Overweight: Secondary | ICD-10-CM | POA: Diagnosis not present

## 2016-07-22 DIAGNOSIS — Z8781 Personal history of (healed) traumatic fracture: Secondary | ICD-10-CM | POA: Diagnosis not present

## 2016-07-22 DIAGNOSIS — E7800 Pure hypercholesterolemia, unspecified: Secondary | ICD-10-CM | POA: Diagnosis not present

## 2016-07-22 DIAGNOSIS — I10 Essential (primary) hypertension: Secondary | ICD-10-CM | POA: Diagnosis not present

## 2016-07-22 DIAGNOSIS — Z9181 History of falling: Secondary | ICD-10-CM | POA: Diagnosis not present

## 2016-07-22 DIAGNOSIS — N183 Chronic kidney disease, stage 3 (moderate): Secondary | ICD-10-CM | POA: Diagnosis not present

## 2017-01-24 DIAGNOSIS — E7800 Pure hypercholesterolemia, unspecified: Secondary | ICD-10-CM | POA: Diagnosis not present

## 2017-01-24 DIAGNOSIS — N183 Chronic kidney disease, stage 3 (moderate): Secondary | ICD-10-CM | POA: Diagnosis not present

## 2017-01-27 DIAGNOSIS — Z6828 Body mass index (BMI) 28.0-28.9, adult: Secondary | ICD-10-CM | POA: Diagnosis not present

## 2017-01-27 DIAGNOSIS — E7800 Pure hypercholesterolemia, unspecified: Secondary | ICD-10-CM | POA: Diagnosis not present

## 2017-01-27 DIAGNOSIS — Z23 Encounter for immunization: Secondary | ICD-10-CM | POA: Diagnosis not present

## 2017-01-27 DIAGNOSIS — I10 Essential (primary) hypertension: Secondary | ICD-10-CM | POA: Diagnosis not present

## 2017-01-27 DIAGNOSIS — N183 Chronic kidney disease, stage 3 (moderate): Secondary | ICD-10-CM | POA: Diagnosis not present

## 2017-04-28 DIAGNOSIS — L570 Actinic keratosis: Secondary | ICD-10-CM | POA: Diagnosis not present

## 2017-04-28 DIAGNOSIS — C44319 Basal cell carcinoma of skin of other parts of face: Secondary | ICD-10-CM | POA: Diagnosis not present

## 2017-04-28 DIAGNOSIS — X32XXXD Exposure to sunlight, subsequent encounter: Secondary | ICD-10-CM | POA: Diagnosis not present

## 2017-06-13 DIAGNOSIS — L570 Actinic keratosis: Secondary | ICD-10-CM | POA: Diagnosis not present

## 2017-06-13 DIAGNOSIS — Z85828 Personal history of other malignant neoplasm of skin: Secondary | ICD-10-CM | POA: Diagnosis not present

## 2017-06-13 DIAGNOSIS — Z08 Encounter for follow-up examination after completed treatment for malignant neoplasm: Secondary | ICD-10-CM | POA: Diagnosis not present

## 2017-06-13 DIAGNOSIS — X32XXXD Exposure to sunlight, subsequent encounter: Secondary | ICD-10-CM | POA: Diagnosis not present

## 2017-06-13 DIAGNOSIS — C44319 Basal cell carcinoma of skin of other parts of face: Secondary | ICD-10-CM | POA: Diagnosis not present

## 2017-07-24 DIAGNOSIS — N183 Chronic kidney disease, stage 3 (moderate): Secondary | ICD-10-CM | POA: Diagnosis not present

## 2017-07-24 DIAGNOSIS — E7800 Pure hypercholesterolemia, unspecified: Secondary | ICD-10-CM | POA: Diagnosis not present

## 2017-07-28 DIAGNOSIS — Z139 Encounter for screening, unspecified: Secondary | ICD-10-CM | POA: Diagnosis not present

## 2017-07-28 DIAGNOSIS — Z6827 Body mass index (BMI) 27.0-27.9, adult: Secondary | ICD-10-CM | POA: Diagnosis not present

## 2017-07-28 DIAGNOSIS — N183 Chronic kidney disease, stage 3 (moderate): Secondary | ICD-10-CM | POA: Diagnosis not present

## 2017-07-28 DIAGNOSIS — Z9181 History of falling: Secondary | ICD-10-CM | POA: Diagnosis not present

## 2017-07-28 DIAGNOSIS — Z136 Encounter for screening for cardiovascular disorders: Secondary | ICD-10-CM | POA: Diagnosis not present

## 2017-07-28 DIAGNOSIS — E7800 Pure hypercholesterolemia, unspecified: Secondary | ICD-10-CM | POA: Diagnosis not present

## 2017-07-28 DIAGNOSIS — E785 Hyperlipidemia, unspecified: Secondary | ICD-10-CM | POA: Diagnosis not present

## 2017-07-28 DIAGNOSIS — Z0000 Encounter for general adult medical examination without abnormal findings: Secondary | ICD-10-CM | POA: Diagnosis not present

## 2017-07-28 DIAGNOSIS — I10 Essential (primary) hypertension: Secondary | ICD-10-CM | POA: Diagnosis not present

## 2017-08-15 DIAGNOSIS — X32XXXD Exposure to sunlight, subsequent encounter: Secondary | ICD-10-CM | POA: Diagnosis not present

## 2017-08-15 DIAGNOSIS — C44319 Basal cell carcinoma of skin of other parts of face: Secondary | ICD-10-CM | POA: Diagnosis not present

## 2017-08-15 DIAGNOSIS — L570 Actinic keratosis: Secondary | ICD-10-CM | POA: Diagnosis not present

## 2017-10-03 IMAGING — RF DG C-ARM 61-120 MIN
1 series · 4 of 4 positions shown · non-contrast
Comparison: 02/13/2016

CLINICAL DATA: Proximal left femoral fracture

EXAM:
OPERATIVE LEFT HIP WITH PELVIS; DG C-ARM 61-120 MIN

[Series 1: run · 4 of 4 slices shown]
[im 1/4]
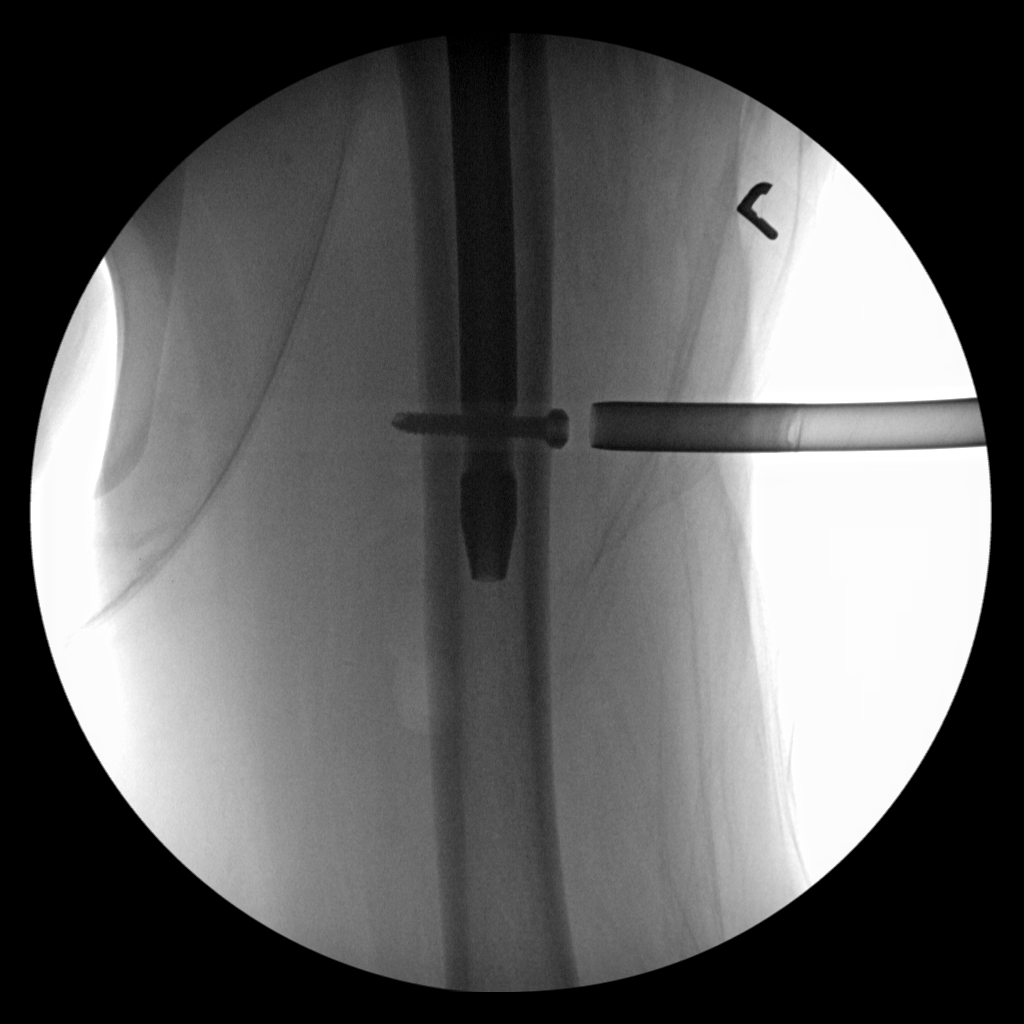
[im 2/4]
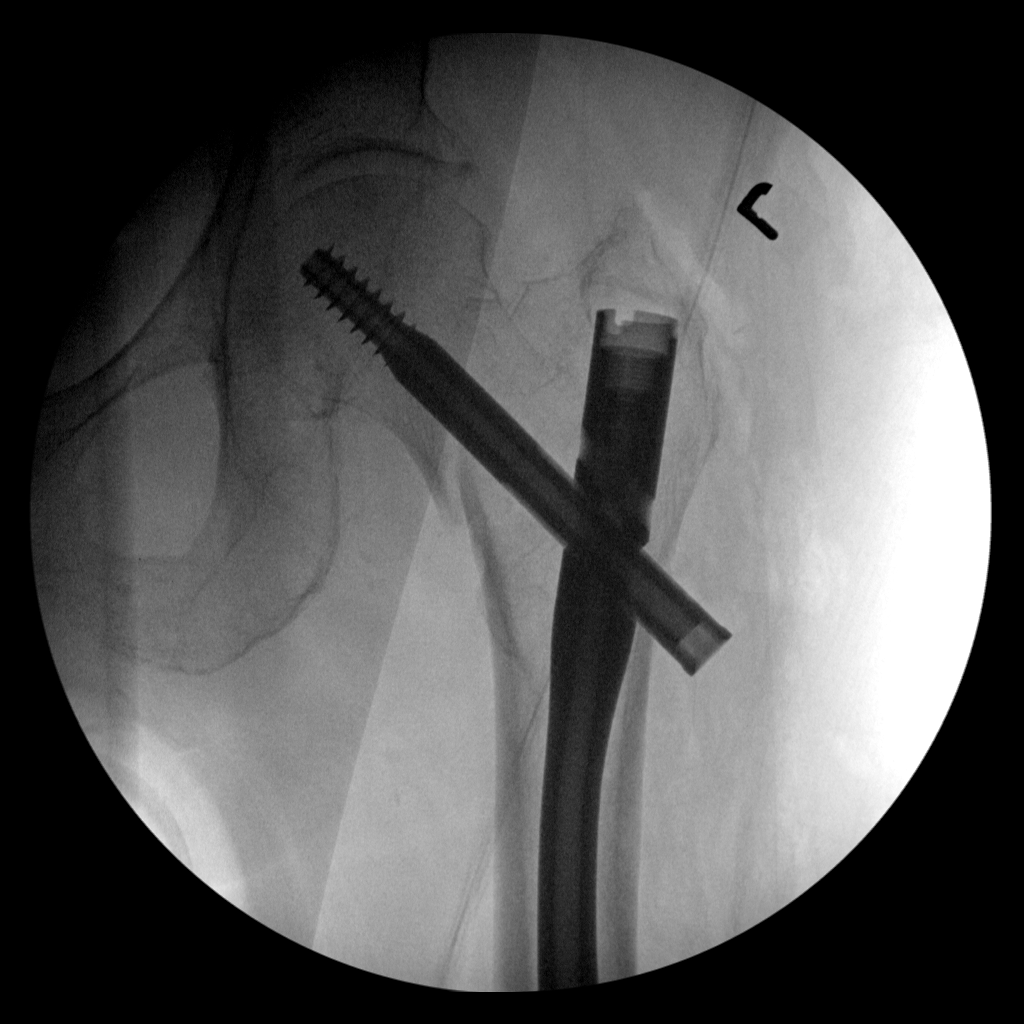
[im 3/4]
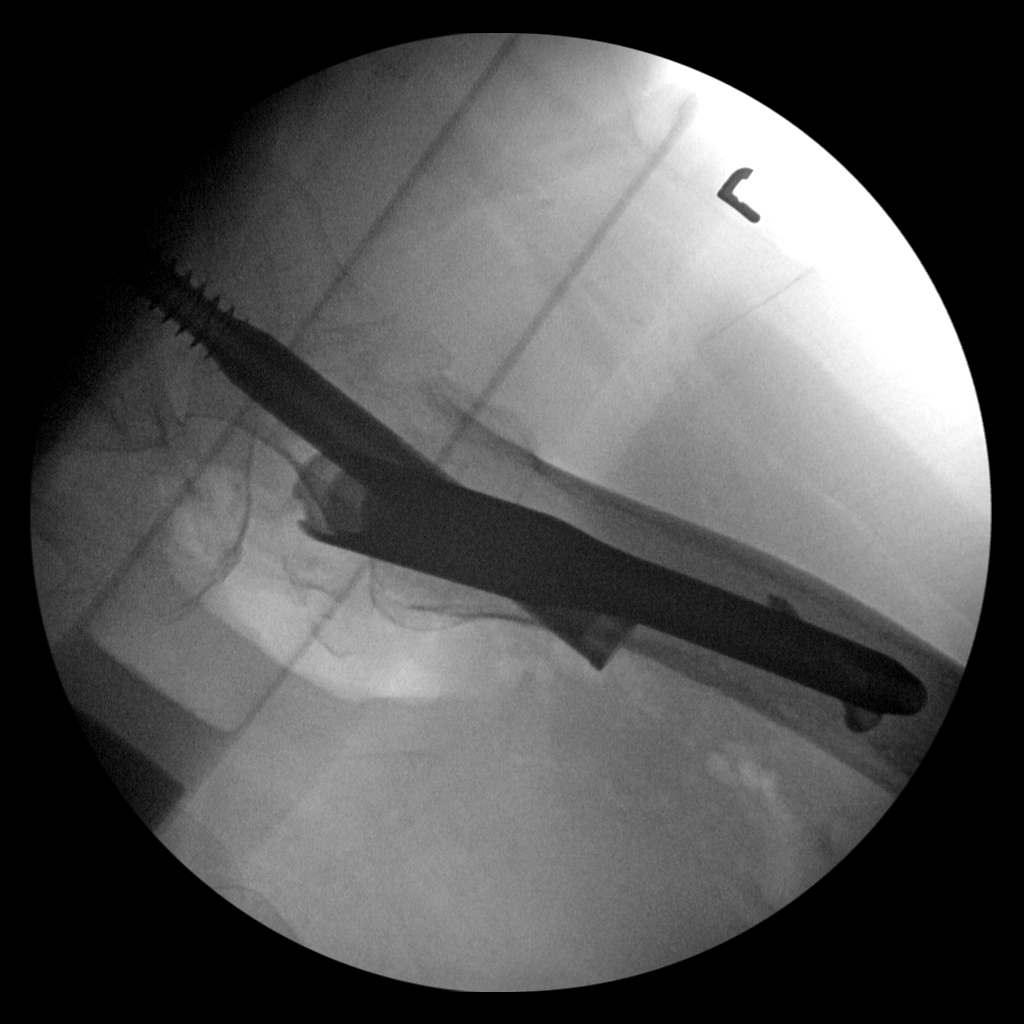
[im 4/4]
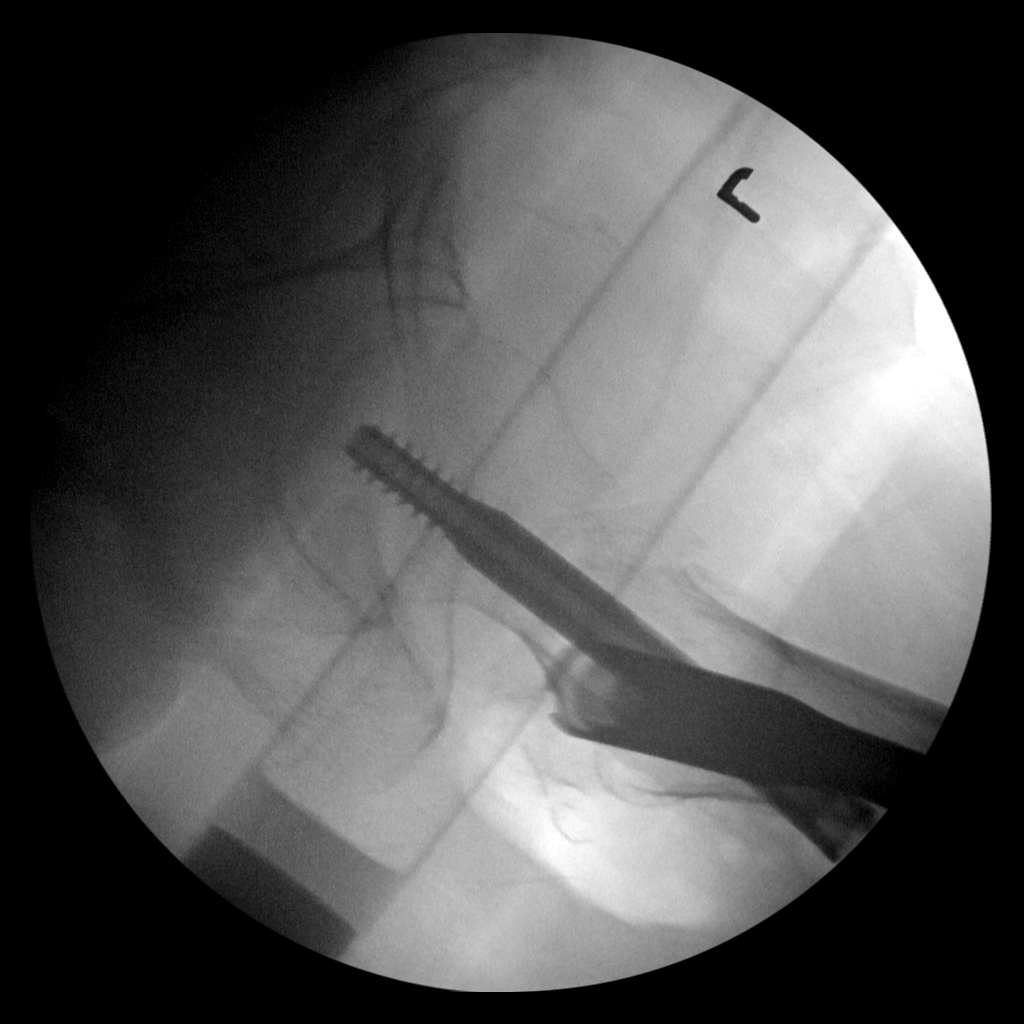

[4 of 4 positions shown; findings below may reference images not displayed]

FLUOROSCOPY TIME:  Radiation Exposure Index (as provided by the
fluoroscopic device): Not available

If the device does not provide the exposure index:

Fluoroscopy Time:  49 seconds

Number of Acquired Images:  4
FINDINGS: Medullary rod and proximal fixation screw are noted. Fracture
fragments are in near anatomic alignment. No soft tissue abnormality
is seen.
IMPRESSION: ORIF of proximal left femoral fracture.

## 2018-04-10 DIAGNOSIS — Z23 Encounter for immunization: Secondary | ICD-10-CM | POA: Diagnosis not present

## 2018-04-10 DIAGNOSIS — R350 Frequency of micturition: Secondary | ICD-10-CM | POA: Diagnosis not present

## 2018-04-10 DIAGNOSIS — N183 Chronic kidney disease, stage 3 (moderate): Secondary | ICD-10-CM | POA: Diagnosis not present

## 2018-04-10 DIAGNOSIS — E7800 Pure hypercholesterolemia, unspecified: Secondary | ICD-10-CM | POA: Diagnosis not present

## 2018-04-10 DIAGNOSIS — I10 Essential (primary) hypertension: Secondary | ICD-10-CM | POA: Diagnosis not present

## 2018-11-05 DIAGNOSIS — I10 Essential (primary) hypertension: Secondary | ICD-10-CM | POA: Diagnosis not present

## 2018-11-20 DIAGNOSIS — R197 Diarrhea, unspecified: Secondary | ICD-10-CM | POA: Diagnosis not present

## 2018-11-20 DIAGNOSIS — E860 Dehydration: Secondary | ICD-10-CM | POA: Diagnosis not present

## 2018-11-20 DIAGNOSIS — R195 Other fecal abnormalities: Secondary | ICD-10-CM | POA: Diagnosis not present

## 2018-11-20 DIAGNOSIS — I252 Old myocardial infarction: Secondary | ICD-10-CM | POA: Diagnosis not present

## 2018-11-20 DIAGNOSIS — I959 Hypotension, unspecified: Secondary | ICD-10-CM | POA: Diagnosis not present

## 2018-11-20 DIAGNOSIS — I9589 Other hypotension: Secondary | ICD-10-CM | POA: Diagnosis not present

## 2018-11-20 DIAGNOSIS — N179 Acute kidney failure, unspecified: Secondary | ICD-10-CM | POA: Diagnosis not present

## 2018-11-20 DIAGNOSIS — E861 Hypovolemia: Secondary | ICD-10-CM | POA: Diagnosis not present

## 2018-11-20 DIAGNOSIS — I10 Essential (primary) hypertension: Secondary | ICD-10-CM | POA: Diagnosis not present

## 2018-11-20 DIAGNOSIS — Z20828 Contact with and (suspected) exposure to other viral communicable diseases: Secondary | ICD-10-CM | POA: Diagnosis not present

## 2018-11-20 DIAGNOSIS — M1990 Unspecified osteoarthritis, unspecified site: Secondary | ICD-10-CM | POA: Diagnosis not present

## 2018-11-21 DIAGNOSIS — Z20828 Contact with and (suspected) exposure to other viral communicable diseases: Secondary | ICD-10-CM | POA: Diagnosis not present

## 2018-11-21 DIAGNOSIS — R195 Other fecal abnormalities: Secondary | ICD-10-CM | POA: Diagnosis not present

## 2018-11-21 DIAGNOSIS — I951 Orthostatic hypotension: Secondary | ICD-10-CM | POA: Diagnosis not present

## 2018-11-21 DIAGNOSIS — N179 Acute kidney failure, unspecified: Secondary | ICD-10-CM | POA: Diagnosis not present

## 2018-11-21 DIAGNOSIS — E860 Dehydration: Secondary | ICD-10-CM | POA: Diagnosis not present

## 2018-11-21 DIAGNOSIS — I10 Essential (primary) hypertension: Secondary | ICD-10-CM | POA: Diagnosis not present

## 2018-11-21 DIAGNOSIS — R197 Diarrhea, unspecified: Secondary | ICD-10-CM | POA: Diagnosis not present

## 2018-12-06 DIAGNOSIS — I10 Essential (primary) hypertension: Secondary | ICD-10-CM | POA: Diagnosis not present

## 2018-12-06 DIAGNOSIS — Z1389 Encounter for screening for other disorder: Secondary | ICD-10-CM | POA: Diagnosis not present

## 2018-12-06 DIAGNOSIS — N289 Disorder of kidney and ureter, unspecified: Secondary | ICD-10-CM | POA: Diagnosis not present

## 2018-12-06 DIAGNOSIS — I2510 Atherosclerotic heart disease of native coronary artery without angina pectoris: Secondary | ICD-10-CM | POA: Diagnosis not present

## 2018-12-06 DIAGNOSIS — Z8719 Personal history of other diseases of the digestive system: Secondary | ICD-10-CM | POA: Diagnosis not present

## 2018-12-06 DIAGNOSIS — R197 Diarrhea, unspecified: Secondary | ICD-10-CM | POA: Diagnosis not present

## 2018-12-25 DIAGNOSIS — I208 Other forms of angina pectoris: Secondary | ICD-10-CM | POA: Diagnosis not present

## 2018-12-25 DIAGNOSIS — E861 Hypovolemia: Secondary | ICD-10-CM | POA: Diagnosis not present

## 2018-12-25 DIAGNOSIS — R0789 Other chest pain: Secondary | ICD-10-CM | POA: Diagnosis not present

## 2018-12-25 DIAGNOSIS — I9589 Other hypotension: Secondary | ICD-10-CM | POA: Diagnosis not present

## 2019-04-15 DIAGNOSIS — R202 Paresthesia of skin: Secondary | ICD-10-CM | POA: Diagnosis not present

## 2019-04-15 DIAGNOSIS — R441 Visual hallucinations: Secondary | ICD-10-CM | POA: Diagnosis not present

## 2019-04-15 DIAGNOSIS — R440 Auditory hallucinations: Secondary | ICD-10-CM | POA: Diagnosis not present

## 2019-04-15 DIAGNOSIS — G319 Degenerative disease of nervous system, unspecified: Secondary | ICD-10-CM | POA: Diagnosis not present

## 2019-04-15 DIAGNOSIS — R200 Anesthesia of skin: Secondary | ICD-10-CM | POA: Diagnosis not present

## 2019-04-15 DIAGNOSIS — R4182 Altered mental status, unspecified: Secondary | ICD-10-CM | POA: Diagnosis not present

## 2019-04-15 DIAGNOSIS — I6782 Cerebral ischemia: Secondary | ICD-10-CM | POA: Diagnosis not present

## 2020-05-04 DIAGNOSIS — I10 Essential (primary) hypertension: Secondary | ICD-10-CM | POA: Diagnosis not present

## 2020-05-04 DIAGNOSIS — Z7982 Long term (current) use of aspirin: Secondary | ICD-10-CM | POA: Diagnosis not present

## 2020-05-04 DIAGNOSIS — Z79899 Other long term (current) drug therapy: Secondary | ICD-10-CM | POA: Diagnosis not present

## 2020-05-04 DIAGNOSIS — Z882 Allergy status to sulfonamides status: Secondary | ICD-10-CM | POA: Diagnosis not present

## 2020-05-04 DIAGNOSIS — R29700 NIHSS score 0: Secondary | ICD-10-CM | POA: Diagnosis not present

## 2020-05-04 DIAGNOSIS — R29810 Facial weakness: Secondary | ICD-10-CM | POA: Diagnosis not present

## 2020-05-04 DIAGNOSIS — E785 Hyperlipidemia, unspecified: Secondary | ICD-10-CM | POA: Diagnosis not present

## 2020-05-04 DIAGNOSIS — G459 Transient cerebral ischemic attack, unspecified: Secondary | ICD-10-CM | POA: Diagnosis not present

## 2020-05-04 DIAGNOSIS — G9389 Other specified disorders of brain: Secondary | ICD-10-CM | POA: Diagnosis not present

## 2020-05-04 DIAGNOSIS — R200 Anesthesia of skin: Secondary | ICD-10-CM | POA: Diagnosis not present

## 2020-05-04 DIAGNOSIS — I2510 Atherosclerotic heart disease of native coronary artery without angina pectoris: Secondary | ICD-10-CM | POA: Diagnosis not present

## 2020-05-04 DIAGNOSIS — I672 Cerebral atherosclerosis: Secondary | ICD-10-CM | POA: Diagnosis not present

## 2020-05-04 DIAGNOSIS — Z7902 Long term (current) use of antithrombotics/antiplatelets: Secondary | ICD-10-CM | POA: Diagnosis not present

## 2020-05-04 DIAGNOSIS — M810 Age-related osteoporosis without current pathological fracture: Secondary | ICD-10-CM | POA: Diagnosis not present

## 2020-05-04 DIAGNOSIS — I6782 Cerebral ischemia: Secondary | ICD-10-CM | POA: Diagnosis not present

## 2020-05-19 DIAGNOSIS — Z139 Encounter for screening, unspecified: Secondary | ICD-10-CM | POA: Diagnosis not present

## 2020-05-19 DIAGNOSIS — Z9181 History of falling: Secondary | ICD-10-CM | POA: Diagnosis not present

## 2020-05-19 DIAGNOSIS — E7800 Pure hypercholesterolemia, unspecified: Secondary | ICD-10-CM | POA: Diagnosis not present

## 2020-05-19 DIAGNOSIS — Z8673 Personal history of transient ischemic attack (TIA), and cerebral infarction without residual deficits: Secondary | ICD-10-CM | POA: Diagnosis not present

## 2020-05-19 DIAGNOSIS — Z1331 Encounter for screening for depression: Secondary | ICD-10-CM | POA: Diagnosis not present

## 2020-06-22 DIAGNOSIS — I272 Pulmonary hypertension, unspecified: Secondary | ICD-10-CM | POA: Diagnosis not present

## 2020-06-22 DIAGNOSIS — G459 Transient cerebral ischemic attack, unspecified: Secondary | ICD-10-CM | POA: Diagnosis not present

## 2020-06-22 DIAGNOSIS — I083 Combined rheumatic disorders of mitral, aortic and tricuspid valves: Secondary | ICD-10-CM | POA: Diagnosis not present

## 2020-06-23 DIAGNOSIS — E785 Hyperlipidemia, unspecified: Secondary | ICD-10-CM | POA: Diagnosis not present

## 2020-06-23 DIAGNOSIS — I10 Essential (primary) hypertension: Secondary | ICD-10-CM | POA: Diagnosis not present

## 2020-06-23 DIAGNOSIS — I2510 Atherosclerotic heart disease of native coronary artery without angina pectoris: Secondary | ICD-10-CM | POA: Diagnosis not present

## 2020-06-23 DIAGNOSIS — R600 Localized edema: Secondary | ICD-10-CM | POA: Diagnosis not present

## 2020-06-23 DIAGNOSIS — G459 Transient cerebral ischemic attack, unspecified: Secondary | ICD-10-CM | POA: Diagnosis not present

## 2020-07-20 DIAGNOSIS — Z043 Encounter for examination and observation following other accident: Secondary | ICD-10-CM | POA: Diagnosis not present

## 2020-07-20 DIAGNOSIS — M545 Low back pain, unspecified: Secondary | ICD-10-CM | POA: Diagnosis not present

## 2020-07-20 DIAGNOSIS — E7800 Pure hypercholesterolemia, unspecified: Secondary | ICD-10-CM | POA: Diagnosis not present

## 2020-07-20 DIAGNOSIS — I10 Essential (primary) hypertension: Secondary | ICD-10-CM | POA: Diagnosis not present

## 2020-07-20 DIAGNOSIS — S60222A Contusion of left hand, initial encounter: Secondary | ICD-10-CM | POA: Diagnosis not present

## 2020-07-20 DIAGNOSIS — M810 Age-related osteoporosis without current pathological fracture: Secondary | ICD-10-CM | POA: Diagnosis not present

## 2020-07-20 DIAGNOSIS — M79602 Pain in left arm: Secondary | ICD-10-CM | POA: Diagnosis not present

## 2020-07-20 DIAGNOSIS — M1990 Unspecified osteoarthritis, unspecified site: Secondary | ICD-10-CM | POA: Diagnosis not present

## 2020-07-20 DIAGNOSIS — M19032 Primary osteoarthritis, left wrist: Secondary | ICD-10-CM | POA: Diagnosis not present

## 2020-07-20 DIAGNOSIS — M1812 Unilateral primary osteoarthritis of first carpometacarpal joint, left hand: Secondary | ICD-10-CM | POA: Diagnosis not present

## 2020-07-20 DIAGNOSIS — Z23 Encounter for immunization: Secondary | ICD-10-CM | POA: Diagnosis not present

## 2020-07-20 DIAGNOSIS — S60212A Contusion of left wrist, initial encounter: Secondary | ICD-10-CM | POA: Diagnosis not present

## 2020-07-24 DIAGNOSIS — G459 Transient cerebral ischemic attack, unspecified: Secondary | ICD-10-CM | POA: Diagnosis not present

## 2020-07-30 DIAGNOSIS — G459 Transient cerebral ischemic attack, unspecified: Secondary | ICD-10-CM | POA: Diagnosis not present

## 2020-08-25 DIAGNOSIS — I2510 Atherosclerotic heart disease of native coronary artery without angina pectoris: Secondary | ICD-10-CM | POA: Diagnosis not present

## 2020-08-25 DIAGNOSIS — G459 Transient cerebral ischemic attack, unspecified: Secondary | ICD-10-CM | POA: Diagnosis not present

## 2020-08-25 DIAGNOSIS — R600 Localized edema: Secondary | ICD-10-CM | POA: Diagnosis not present

## 2020-08-25 DIAGNOSIS — I10 Essential (primary) hypertension: Secondary | ICD-10-CM | POA: Diagnosis not present

## 2020-08-25 DIAGNOSIS — E785 Hyperlipidemia, unspecified: Secondary | ICD-10-CM | POA: Diagnosis not present

## 2020-08-29 DIAGNOSIS — Z8673 Personal history of transient ischemic attack (TIA), and cerebral infarction without residual deficits: Secondary | ICD-10-CM | POA: Diagnosis not present

## 2020-08-29 DIAGNOSIS — R7989 Other specified abnormal findings of blood chemistry: Secondary | ICD-10-CM | POA: Diagnosis not present

## 2020-08-29 DIAGNOSIS — S0990XA Unspecified injury of head, initial encounter: Secondary | ICD-10-CM | POA: Diagnosis not present

## 2020-08-29 DIAGNOSIS — Z7982 Long term (current) use of aspirin: Secondary | ICD-10-CM | POA: Diagnosis not present

## 2020-08-29 DIAGNOSIS — E7800 Pure hypercholesterolemia, unspecified: Secondary | ICD-10-CM | POA: Diagnosis not present

## 2020-08-29 DIAGNOSIS — R296 Repeated falls: Secondary | ICD-10-CM | POA: Diagnosis not present

## 2020-08-29 DIAGNOSIS — W19XXXA Unspecified fall, initial encounter: Secondary | ICD-10-CM | POA: Diagnosis not present

## 2020-08-29 DIAGNOSIS — S0181XA Laceration without foreign body of other part of head, initial encounter: Secondary | ICD-10-CM | POA: Diagnosis not present

## 2020-08-29 DIAGNOSIS — I10 Essential (primary) hypertension: Secondary | ICD-10-CM | POA: Diagnosis not present

## 2020-08-29 DIAGNOSIS — S51811A Laceration without foreign body of right forearm, initial encounter: Secondary | ICD-10-CM | POA: Diagnosis not present

## 2020-08-29 DIAGNOSIS — I2510 Atherosclerotic heart disease of native coronary artery without angina pectoris: Secondary | ICD-10-CM | POA: Diagnosis not present

## 2020-08-29 DIAGNOSIS — R58 Hemorrhage, not elsewhere classified: Secondary | ICD-10-CM | POA: Diagnosis not present

## 2020-08-29 DIAGNOSIS — Z79899 Other long term (current) drug therapy: Secondary | ICD-10-CM | POA: Diagnosis not present

## 2020-08-29 DIAGNOSIS — S0511XA Contusion of eyeball and orbital tissues, right eye, initial encounter: Secondary | ICD-10-CM | POA: Diagnosis not present

## 2020-08-29 DIAGNOSIS — M810 Age-related osteoporosis without current pathological fracture: Secondary | ICD-10-CM | POA: Diagnosis not present

## 2020-08-29 DIAGNOSIS — Z20822 Contact with and (suspected) exposure to covid-19: Secondary | ICD-10-CM | POA: Diagnosis not present

## 2020-08-30 DIAGNOSIS — S0511XA Contusion of eyeball and orbital tissues, right eye, initial encounter: Secondary | ICD-10-CM | POA: Diagnosis not present

## 2020-08-30 DIAGNOSIS — S0990XA Unspecified injury of head, initial encounter: Secondary | ICD-10-CM | POA: Diagnosis not present

## 2020-09-10 DIAGNOSIS — S41111A Laceration without foreign body of right upper arm, initial encounter: Secondary | ICD-10-CM | POA: Diagnosis not present

## 2020-09-10 DIAGNOSIS — G319 Degenerative disease of nervous system, unspecified: Secondary | ICD-10-CM | POA: Diagnosis not present

## 2020-09-10 DIAGNOSIS — R7989 Other specified abnormal findings of blood chemistry: Secondary | ICD-10-CM | POA: Diagnosis not present

## 2020-09-10 DIAGNOSIS — E038 Other specified hypothyroidism: Secondary | ICD-10-CM | POA: Diagnosis not present

## 2020-09-10 DIAGNOSIS — S0181XA Laceration without foreign body of other part of head, initial encounter: Secondary | ICD-10-CM | POA: Diagnosis not present

## 2020-09-10 DIAGNOSIS — N1832 Chronic kidney disease, stage 3b: Secondary | ICD-10-CM | POA: Diagnosis not present

## 2020-09-10 DIAGNOSIS — Z6826 Body mass index (BMI) 26.0-26.9, adult: Secondary | ICD-10-CM | POA: Diagnosis not present

## 2020-11-15 DIAGNOSIS — E7800 Pure hypercholesterolemia, unspecified: Secondary | ICD-10-CM | POA: Diagnosis not present

## 2020-11-15 DIAGNOSIS — N390 Urinary tract infection, site not specified: Secondary | ICD-10-CM | POA: Diagnosis not present

## 2020-11-15 DIAGNOSIS — R6883 Chills (without fever): Secondary | ICD-10-CM | POA: Diagnosis not present

## 2020-11-15 DIAGNOSIS — I10 Essential (primary) hypertension: Secondary | ICD-10-CM | POA: Diagnosis not present

## 2020-11-15 DIAGNOSIS — R531 Weakness: Secondary | ICD-10-CM | POA: Diagnosis not present

## 2020-11-15 DIAGNOSIS — R0602 Shortness of breath: Secondary | ICD-10-CM | POA: Diagnosis not present

## 2020-11-15 DIAGNOSIS — Z20822 Contact with and (suspected) exposure to covid-19: Secondary | ICD-10-CM | POA: Diagnosis not present

## 2020-11-15 DIAGNOSIS — Z79899 Other long term (current) drug therapy: Secondary | ICD-10-CM | POA: Diagnosis not present

## 2020-11-15 DIAGNOSIS — R519 Headache, unspecified: Secondary | ICD-10-CM | POA: Diagnosis not present

## 2020-11-15 DIAGNOSIS — Z7982 Long term (current) use of aspirin: Secondary | ICD-10-CM | POA: Diagnosis not present

## 2022-04-28 DIAGNOSIS — R600 Localized edema: Secondary | ICD-10-CM | POA: Diagnosis not present

## 2022-04-28 DIAGNOSIS — E785 Hyperlipidemia, unspecified: Secondary | ICD-10-CM | POA: Diagnosis not present

## 2022-04-28 DIAGNOSIS — G459 Transient cerebral ischemic attack, unspecified: Secondary | ICD-10-CM | POA: Diagnosis not present

## 2022-04-28 DIAGNOSIS — I10 Essential (primary) hypertension: Secondary | ICD-10-CM | POA: Diagnosis not present

## 2022-04-28 DIAGNOSIS — I2510 Atherosclerotic heart disease of native coronary artery without angina pectoris: Secondary | ICD-10-CM | POA: Diagnosis not present

## 2023-06-13 DIAGNOSIS — E782 Mixed hyperlipidemia: Secondary | ICD-10-CM | POA: Diagnosis not present

## 2023-06-13 DIAGNOSIS — I2510 Atherosclerotic heart disease of native coronary artery without angina pectoris: Secondary | ICD-10-CM | POA: Diagnosis not present

## 2023-06-13 DIAGNOSIS — I10 Essential (primary) hypertension: Secondary | ICD-10-CM | POA: Diagnosis not present

## 2023-06-13 DIAGNOSIS — G459 Transient cerebral ischemic attack, unspecified: Secondary | ICD-10-CM | POA: Diagnosis not present
